# Patient Record
Sex: Female | Born: 1940
Health system: Southern US, Community
[De-identification: ages and names within clinical notes are randomized; demographics above are authoritative.]

## PROBLEM LIST (undated history)

## (undated) DIAGNOSIS — G4730 Sleep apnea, unspecified: Secondary | ICD-10-CM

## (undated) DIAGNOSIS — K219 Gastro-esophageal reflux disease without esophagitis: Secondary | ICD-10-CM

## (undated) DIAGNOSIS — E7800 Pure hypercholesterolemia, unspecified: Secondary | ICD-10-CM

## (undated) DIAGNOSIS — I10 Essential (primary) hypertension: Secondary | ICD-10-CM

## (undated) DIAGNOSIS — M8580 Other specified disorders of bone density and structure, unspecified site: Secondary | ICD-10-CM

## (undated) HISTORY — PX: TUBAL LIGATION: SHX77

## (undated) HISTORY — PX: BREAST SURGERY: SHX581

## (undated) HISTORY — DX: Other specified disorders of bone density and structure, unspecified site: M85.80

## (undated) HISTORY — PX: WRIST SURGERY: SHX841

## (undated) HISTORY — PX: APPENDECTOMY: SHX54

## (undated) HISTORY — DX: Pure hypercholesterolemia, unspecified: E78.00

## (undated) HISTORY — PX: TONSILLECTOMY AND ADENOIDECTOMY: SUR1326

## (undated) HISTORY — DX: Essential (primary) hypertension: I10

---

## 1968-11-05 HISTORY — PX: LIPOMA EXCISION: SHX5283

## 1998-10-13 ENCOUNTER — Other Ambulatory Visit: Admission: RE | Admit: 1998-10-13 | Discharge: 1998-10-13 | Payer: Self-pay | Admitting: Obstetrics and Gynecology

## 1999-11-16 ENCOUNTER — Other Ambulatory Visit: Admission: RE | Admit: 1999-11-16 | Discharge: 1999-11-16 | Payer: Self-pay | Admitting: Obstetrics and Gynecology

## 2000-11-22 ENCOUNTER — Other Ambulatory Visit: Admission: RE | Admit: 2000-11-22 | Discharge: 2000-11-22 | Payer: Self-pay | Admitting: Obstetrics and Gynecology

## 2001-11-26 ENCOUNTER — Other Ambulatory Visit: Admission: RE | Admit: 2001-11-26 | Discharge: 2001-11-26 | Payer: Self-pay | Admitting: Obstetrics and Gynecology

## 2003-02-05 ENCOUNTER — Other Ambulatory Visit: Admission: RE | Admit: 2003-02-05 | Discharge: 2003-02-05 | Payer: Self-pay | Admitting: Obstetrics and Gynecology

## 2004-02-07 ENCOUNTER — Other Ambulatory Visit: Admission: RE | Admit: 2004-02-07 | Discharge: 2004-02-07 | Payer: Self-pay | Admitting: Obstetrics and Gynecology

## 2005-02-26 ENCOUNTER — Other Ambulatory Visit: Admission: RE | Admit: 2005-02-26 | Discharge: 2005-02-26 | Payer: Self-pay | Admitting: Obstetrics and Gynecology

## 2006-06-11 ENCOUNTER — Other Ambulatory Visit: Admission: RE | Admit: 2006-06-11 | Discharge: 2006-06-11 | Payer: Self-pay | Admitting: Obstetrics and Gynecology

## 2007-09-29 ENCOUNTER — Encounter: Admission: RE | Admit: 2007-09-29 | Discharge: 2007-09-29 | Payer: Self-pay | Admitting: Orthopedic Surgery

## 2008-11-26 ENCOUNTER — Other Ambulatory Visit: Admission: RE | Admit: 2008-11-26 | Discharge: 2008-11-26 | Payer: Self-pay | Admitting: Obstetrics and Gynecology

## 2008-11-26 ENCOUNTER — Encounter: Payer: Self-pay | Admitting: Obstetrics and Gynecology

## 2008-11-26 ENCOUNTER — Ambulatory Visit: Payer: Self-pay | Admitting: Obstetrics and Gynecology

## 2008-12-20 ENCOUNTER — Ambulatory Visit: Payer: Self-pay | Admitting: Obstetrics and Gynecology

## 2009-11-28 ENCOUNTER — Ambulatory Visit: Payer: Self-pay | Admitting: Obstetrics and Gynecology

## 2011-07-19 ENCOUNTER — Other Ambulatory Visit: Payer: Self-pay | Admitting: *Deleted

## 2011-07-19 DIAGNOSIS — M949 Disorder of cartilage, unspecified: Secondary | ICD-10-CM

## 2011-07-24 ENCOUNTER — Other Ambulatory Visit: Payer: Self-pay

## 2011-07-24 DIAGNOSIS — N6310 Unspecified lump in the right breast, unspecified quadrant: Secondary | ICD-10-CM

## 2011-07-24 NOTE — Progress Notes (Signed)
FAXED ORDERS TO [facility] 07-23-11.

## 2011-07-30 ENCOUNTER — Other Ambulatory Visit: Payer: Self-pay | Admitting: Gynecology

## 2011-07-30 DIAGNOSIS — N6310 Unspecified lump in the right breast, unspecified quadrant: Secondary | ICD-10-CM

## 2011-08-02 ENCOUNTER — Encounter: Payer: Self-pay | Admitting: Obstetrics and Gynecology

## 2011-09-24 ENCOUNTER — Encounter: Payer: Self-pay | Admitting: Gynecology

## 2011-09-24 DIAGNOSIS — M8580 Other specified disorders of bone density and structure, unspecified site: Secondary | ICD-10-CM | POA: Insufficient documentation

## 2011-10-03 ENCOUNTER — Encounter: Payer: Self-pay | Admitting: Obstetrics and Gynecology

## 2011-10-03 ENCOUNTER — Ambulatory Visit (INDEPENDENT_AMBULATORY_CARE_PROVIDER_SITE_OTHER): Payer: Medicare Other | Admitting: Obstetrics and Gynecology

## 2011-10-03 ENCOUNTER — Other Ambulatory Visit (HOSPITAL_COMMUNITY)
Admission: RE | Admit: 2011-10-03 | Discharge: 2011-10-03 | Disposition: A | Discharge status: Home / Self Care | Payer: Medicare Other | Source: Ambulatory Visit | Attending: Obstetrics and Gynecology | Admitting: Obstetrics and Gynecology

## 2011-10-03 VITALS — BP 124/74 | Ht 62.0 in | Wt 145.0 lb

## 2011-10-03 DIAGNOSIS — Z780 Asymptomatic menopausal state: Secondary | ICD-10-CM

## 2011-10-03 DIAGNOSIS — Z124 Encounter for screening for malignant neoplasm of cervix: Secondary | ICD-10-CM | POA: Insufficient documentation

## 2011-10-03 DIAGNOSIS — M899 Disorder of bone, unspecified: Secondary | ICD-10-CM

## 2011-10-03 DIAGNOSIS — N952 Postmenopausal atrophic vaginitis: Secondary | ICD-10-CM

## 2011-10-03 DIAGNOSIS — N951 Menopausal and female climacteric states: Secondary | ICD-10-CM

## 2011-10-03 DIAGNOSIS — N63 Unspecified lump in unspecified breast: Secondary | ICD-10-CM

## 2011-10-03 DIAGNOSIS — M8580 Other specified disorders of bone density and structure, unspecified site: Secondary | ICD-10-CM

## 2011-10-03 NOTE — Progress Notes (Signed)
Subjective:     Patient ID: Ann Fox, female   DOB: 10-Mar-1941, 70 y.o.   MRN: [id number]  HPIthe patient came back to see me today for further followup. We have had her on Boniva for bone loss. She has now stopped it and her bone density has remained stable. She is taking calcium and vitamin D. She's had no fractures. We are also watching her with a lump in her right breast. It has remained stable on both mammogram and ultrasound. The radiologist diagnosis is that it's a lipoma. She is having minimal hot flashes. She is also having vaginal dryness. She has used both a vaginal ring and vagifem.  She is currently using nothing and feels she is okay without treatment. She is having no vaginal bleeding or pelvic pain.   Review of Systems  Constitutional: Negative.   HENT: Negative.   Eyes: Negative.   Respiratory: Negative.   Cardiovascular:       Hyperlipidemia  Gastrointestinal:       GERD on treatment  Genitourinary: Negative.   Musculoskeletal: Positive for arthralgias.  Skin: Negative.   Neurological: Negative.   Hematological: Negative.   Psychiatric/Behavioral: Negative.        Objective:   Physical ExamPhysical examination:  [name] present HEENT within normal limits. Neck: Thyroid not large. No masses. Supraclavicular nodes: not enlarged. Breasts: Examined in both sitting midline position. No skin changes and no masses in left breast. In right breast at 9:00 there is a dominant lesion of 1+ centimeter unchanged from previous exams. Abdomen: Soft no guarding rebound or masses or hernia. Pelvic: External: Within normal limits. BUS: Within normal limits. Vaginal:within normal limits. Good estrogen effect. No evidence of cystocele rectocele or enterocele. Cervix: clean. Uterus: Normal size and shape. Adnexa: No masses. Rectovaginal exam: Confirmatory and negative. Extremities: Within normal limits.    Assessment:    #1. Stable lipoma right breast #2. Atrophic vaginitis  #3. Osteopenia #4. Menopausal symptoms     Plan:     We a long discussion about her osteopenia. Although she has significant bone loss we have elected for the moment to continue her drug holiday. She will continue calcium and vitamin D. Obviously she will report to me any fractures. We will continue to reassess this. We will continue observation of her lipoma. She will continue yearly mammograms.

## 2014-09-06 ENCOUNTER — Encounter: Payer: Self-pay | Admitting: Obstetrics and Gynecology

## 2015-11-10 ENCOUNTER — Other Ambulatory Visit: Payer: Self-pay | Admitting: Orthopaedic Surgery

## 2015-11-10 DIAGNOSIS — M25511 Pain in right shoulder: Secondary | ICD-10-CM

## 2015-11-17 ENCOUNTER — Other Ambulatory Visit: Payer: BC Managed Care – PPO

## 2015-11-24 ENCOUNTER — Ambulatory Visit
Admission: RE | Admit: 2015-11-24 | Discharge: 2015-11-24 | Disposition: A | Discharge status: Home / Self Care | Payer: PPO | Source: Ambulatory Visit | Attending: Orthopaedic Surgery | Admitting: Orthopaedic Surgery

## 2015-11-24 DIAGNOSIS — M19011 Primary osteoarthritis, right shoulder: Secondary | ICD-10-CM | POA: Diagnosis not present

## 2015-11-24 DIAGNOSIS — M25511 Pain in right shoulder: Secondary | ICD-10-CM

## 2016-01-05 DIAGNOSIS — L728 Other follicular cysts of the skin and subcutaneous tissue: Secondary | ICD-10-CM | POA: Diagnosis not present

## 2016-01-16 DIAGNOSIS — H2513 Age-related nuclear cataract, bilateral: Secondary | ICD-10-CM | POA: Diagnosis not present

## 2016-03-30 DIAGNOSIS — H25812 Combined forms of age-related cataract, left eye: Secondary | ICD-10-CM | POA: Diagnosis not present

## 2016-04-16 DIAGNOSIS — H2513 Age-related nuclear cataract, bilateral: Secondary | ICD-10-CM | POA: Diagnosis not present

## 2016-04-16 DIAGNOSIS — H2512 Age-related nuclear cataract, left eye: Secondary | ICD-10-CM | POA: Diagnosis not present

## 2016-04-16 DIAGNOSIS — H25812 Combined forms of age-related cataract, left eye: Secondary | ICD-10-CM | POA: Diagnosis not present

## 2016-04-25 DIAGNOSIS — H2513 Age-related nuclear cataract, bilateral: Secondary | ICD-10-CM | POA: Diagnosis not present

## 2016-04-25 DIAGNOSIS — H25811 Combined forms of age-related cataract, right eye: Secondary | ICD-10-CM | POA: Diagnosis not present

## 2016-04-25 DIAGNOSIS — H2511 Age-related nuclear cataract, right eye: Secondary | ICD-10-CM | POA: Diagnosis not present

## 2016-04-25 DIAGNOSIS — H40013 Open angle with borderline findings, low risk, bilateral: Secondary | ICD-10-CM | POA: Diagnosis not present

## 2016-07-03 DIAGNOSIS — I10 Essential (primary) hypertension: Secondary | ICD-10-CM | POA: Diagnosis not present

## 2016-07-03 DIAGNOSIS — E782 Mixed hyperlipidemia: Secondary | ICD-10-CM | POA: Diagnosis not present

## 2016-07-03 DIAGNOSIS — M2550 Pain in unspecified joint: Secondary | ICD-10-CM | POA: Diagnosis not present

## 2016-07-03 DIAGNOSIS — G4730 Sleep apnea, unspecified: Secondary | ICD-10-CM | POA: Diagnosis not present

## 2016-07-03 DIAGNOSIS — K219 Gastro-esophageal reflux disease without esophagitis: Secondary | ICD-10-CM | POA: Diagnosis not present

## 2016-07-03 DIAGNOSIS — Z0000 Encounter for general adult medical examination without abnormal findings: Secondary | ICD-10-CM | POA: Diagnosis not present

## 2016-07-03 DIAGNOSIS — Z9181 History of falling: Secondary | ICD-10-CM | POA: Diagnosis not present

## 2016-07-03 DIAGNOSIS — Z79899 Other long term (current) drug therapy: Secondary | ICD-10-CM | POA: Diagnosis not present

## 2016-08-06 DIAGNOSIS — G4733 Obstructive sleep apnea (adult) (pediatric): Secondary | ICD-10-CM | POA: Diagnosis not present

## 2016-08-07 DIAGNOSIS — H02831 Dermatochalasis of right upper eyelid: Secondary | ICD-10-CM | POA: Diagnosis not present

## 2016-08-07 DIAGNOSIS — H02834 Dermatochalasis of left upper eyelid: Secondary | ICD-10-CM | POA: Diagnosis not present

## 2016-08-08 DIAGNOSIS — L728 Other follicular cysts of the skin and subcutaneous tissue: Secondary | ICD-10-CM | POA: Diagnosis not present

## 2016-08-08 DIAGNOSIS — L918 Other hypertrophic disorders of the skin: Secondary | ICD-10-CM | POA: Diagnosis not present

## 2016-08-31 DIAGNOSIS — H02834 Dermatochalasis of left upper eyelid: Secondary | ICD-10-CM | POA: Diagnosis not present

## 2016-08-31 DIAGNOSIS — H53453 Other localized visual field defect, bilateral: Secondary | ICD-10-CM | POA: Diagnosis not present

## 2016-08-31 DIAGNOSIS — H02831 Dermatochalasis of right upper eyelid: Secondary | ICD-10-CM | POA: Diagnosis not present

## 2016-09-12 DIAGNOSIS — Z23 Encounter for immunization: Secondary | ICD-10-CM | POA: Diagnosis not present

## 2016-09-13 DIAGNOSIS — G4733 Obstructive sleep apnea (adult) (pediatric): Secondary | ICD-10-CM | POA: Diagnosis not present

## 2016-09-13 DIAGNOSIS — I10 Essential (primary) hypertension: Secondary | ICD-10-CM | POA: Diagnosis not present

## 2016-10-08 DIAGNOSIS — H04123 Dry eye syndrome of bilateral lacrimal glands: Secondary | ICD-10-CM | POA: Diagnosis not present

## 2016-10-08 DIAGNOSIS — H52223 Regular astigmatism, bilateral: Secondary | ICD-10-CM | POA: Diagnosis not present

## 2016-10-13 DIAGNOSIS — I10 Essential (primary) hypertension: Secondary | ICD-10-CM | POA: Diagnosis not present

## 2016-10-13 DIAGNOSIS — G4733 Obstructive sleep apnea (adult) (pediatric): Secondary | ICD-10-CM | POA: Diagnosis not present

## 2016-11-13 DIAGNOSIS — I10 Essential (primary) hypertension: Secondary | ICD-10-CM | POA: Diagnosis not present

## 2016-11-13 DIAGNOSIS — G4733 Obstructive sleep apnea (adult) (pediatric): Secondary | ICD-10-CM | POA: Diagnosis not present

## 2016-11-30 DIAGNOSIS — L570 Actinic keratosis: Secondary | ICD-10-CM | POA: Diagnosis not present

## 2016-11-30 DIAGNOSIS — L578 Other skin changes due to chronic exposure to nonionizing radiation: Secondary | ICD-10-CM | POA: Diagnosis not present

## 2016-11-30 DIAGNOSIS — L821 Other seborrheic keratosis: Secondary | ICD-10-CM | POA: Diagnosis not present

## 2016-12-13 DIAGNOSIS — G4733 Obstructive sleep apnea (adult) (pediatric): Secondary | ICD-10-CM | POA: Diagnosis not present

## 2016-12-13 DIAGNOSIS — Z1389 Encounter for screening for other disorder: Secondary | ICD-10-CM | POA: Diagnosis not present

## 2016-12-14 DIAGNOSIS — I10 Essential (primary) hypertension: Secondary | ICD-10-CM | POA: Diagnosis not present

## 2016-12-14 DIAGNOSIS — G4733 Obstructive sleep apnea (adult) (pediatric): Secondary | ICD-10-CM | POA: Diagnosis not present

## 2016-12-15 DIAGNOSIS — G4733 Obstructive sleep apnea (adult) (pediatric): Secondary | ICD-10-CM | POA: Diagnosis not present

## 2016-12-15 DIAGNOSIS — I10 Essential (primary) hypertension: Secondary | ICD-10-CM | POA: Diagnosis not present

## 2017-01-11 DIAGNOSIS — G4733 Obstructive sleep apnea (adult) (pediatric): Secondary | ICD-10-CM | POA: Diagnosis not present

## 2017-01-11 DIAGNOSIS — I10 Essential (primary) hypertension: Secondary | ICD-10-CM | POA: Diagnosis not present

## 2017-01-29 DIAGNOSIS — R102 Pelvic and perineal pain: Secondary | ICD-10-CM | POA: Diagnosis not present

## 2017-01-29 DIAGNOSIS — N814 Uterovaginal prolapse, unspecified: Secondary | ICD-10-CM | POA: Diagnosis not present

## 2017-02-11 DIAGNOSIS — G4733 Obstructive sleep apnea (adult) (pediatric): Secondary | ICD-10-CM | POA: Diagnosis not present

## 2017-02-11 DIAGNOSIS — I10 Essential (primary) hypertension: Secondary | ICD-10-CM | POA: Diagnosis not present

## 2017-03-05 DIAGNOSIS — N814 Uterovaginal prolapse, unspecified: Secondary | ICD-10-CM | POA: Diagnosis not present

## 2017-03-13 DIAGNOSIS — G4733 Obstructive sleep apnea (adult) (pediatric): Secondary | ICD-10-CM | POA: Diagnosis not present

## 2017-03-13 DIAGNOSIS — I10 Essential (primary) hypertension: Secondary | ICD-10-CM | POA: Diagnosis not present

## 2017-03-18 DIAGNOSIS — G4733 Obstructive sleep apnea (adult) (pediatric): Secondary | ICD-10-CM | POA: Diagnosis not present

## 2017-03-18 DIAGNOSIS — I10 Essential (primary) hypertension: Secondary | ICD-10-CM | POA: Diagnosis not present

## 2017-03-26 DIAGNOSIS — N814 Uterovaginal prolapse, unspecified: Secondary | ICD-10-CM | POA: Diagnosis not present

## 2017-04-13 DIAGNOSIS — G4733 Obstructive sleep apnea (adult) (pediatric): Secondary | ICD-10-CM | POA: Diagnosis not present

## 2017-04-13 DIAGNOSIS — I10 Essential (primary) hypertension: Secondary | ICD-10-CM | POA: Diagnosis not present

## 2017-04-16 DIAGNOSIS — N814 Uterovaginal prolapse, unspecified: Secondary | ICD-10-CM | POA: Diagnosis not present

## 2017-05-13 DIAGNOSIS — I10 Essential (primary) hypertension: Secondary | ICD-10-CM | POA: Diagnosis not present

## 2017-05-13 DIAGNOSIS — G4733 Obstructive sleep apnea (adult) (pediatric): Secondary | ICD-10-CM | POA: Diagnosis not present

## 2017-05-15 DIAGNOSIS — Z4689 Encounter for fitting and adjustment of other specified devices: Secondary | ICD-10-CM | POA: Diagnosis not present

## 2017-06-13 DIAGNOSIS — G4733 Obstructive sleep apnea (adult) (pediatric): Secondary | ICD-10-CM | POA: Diagnosis not present

## 2017-06-13 DIAGNOSIS — I10 Essential (primary) hypertension: Secondary | ICD-10-CM | POA: Diagnosis not present

## 2017-07-09 DIAGNOSIS — Z800 Family history of malignant neoplasm of digestive organs: Secondary | ICD-10-CM | POA: Diagnosis not present

## 2017-07-09 DIAGNOSIS — Z1231 Encounter for screening mammogram for malignant neoplasm of breast: Secondary | ICD-10-CM | POA: Diagnosis not present

## 2017-07-14 DIAGNOSIS — I10 Essential (primary) hypertension: Secondary | ICD-10-CM | POA: Diagnosis not present

## 2017-07-14 DIAGNOSIS — G4733 Obstructive sleep apnea (adult) (pediatric): Secondary | ICD-10-CM | POA: Diagnosis not present

## 2017-07-24 DIAGNOSIS — I10 Essential (primary) hypertension: Secondary | ICD-10-CM | POA: Diagnosis not present

## 2017-07-24 DIAGNOSIS — M8580 Other specified disorders of bone density and structure, unspecified site: Secondary | ICD-10-CM | POA: Diagnosis not present

## 2017-07-24 DIAGNOSIS — E782 Mixed hyperlipidemia: Secondary | ICD-10-CM | POA: Diagnosis not present

## 2017-07-24 DIAGNOSIS — N814 Uterovaginal prolapse, unspecified: Secondary | ICD-10-CM | POA: Diagnosis not present

## 2017-07-24 DIAGNOSIS — K219 Gastro-esophageal reflux disease without esophagitis: Secondary | ICD-10-CM | POA: Diagnosis not present

## 2017-07-24 DIAGNOSIS — M129 Arthropathy, unspecified: Secondary | ICD-10-CM | POA: Diagnosis not present

## 2017-07-24 DIAGNOSIS — Z23 Encounter for immunization: Secondary | ICD-10-CM | POA: Diagnosis not present

## 2017-07-24 DIAGNOSIS — M25552 Pain in left hip: Secondary | ICD-10-CM | POA: Diagnosis not present

## 2017-07-24 DIAGNOSIS — M25551 Pain in right hip: Secondary | ICD-10-CM | POA: Diagnosis not present

## 2017-08-02 DIAGNOSIS — I10 Essential (primary) hypertension: Secondary | ICD-10-CM | POA: Diagnosis not present

## 2017-08-02 DIAGNOSIS — G4733 Obstructive sleep apnea (adult) (pediatric): Secondary | ICD-10-CM | POA: Diagnosis not present

## 2017-08-12 DIAGNOSIS — L719 Rosacea, unspecified: Secondary | ICD-10-CM | POA: Diagnosis not present

## 2017-08-13 DIAGNOSIS — G4733 Obstructive sleep apnea (adult) (pediatric): Secondary | ICD-10-CM | POA: Diagnosis not present

## 2017-08-13 DIAGNOSIS — I10 Essential (primary) hypertension: Secondary | ICD-10-CM | POA: Diagnosis not present

## 2017-09-13 DIAGNOSIS — I10 Essential (primary) hypertension: Secondary | ICD-10-CM | POA: Diagnosis not present

## 2017-09-13 DIAGNOSIS — G4733 Obstructive sleep apnea (adult) (pediatric): Secondary | ICD-10-CM | POA: Diagnosis not present

## 2017-09-24 DIAGNOSIS — L304 Erythema intertrigo: Secondary | ICD-10-CM | POA: Diagnosis not present

## 2017-09-24 DIAGNOSIS — L918 Other hypertrophic disorders of the skin: Secondary | ICD-10-CM | POA: Diagnosis not present

## 2017-11-03 DIAGNOSIS — I10 Essential (primary) hypertension: Secondary | ICD-10-CM | POA: Diagnosis not present

## 2017-11-03 DIAGNOSIS — G4733 Obstructive sleep apnea (adult) (pediatric): Secondary | ICD-10-CM | POA: Diagnosis not present

## 2017-11-04 DIAGNOSIS — S60912A Unspecified superficial injury of left wrist, initial encounter: Secondary | ICD-10-CM | POA: Diagnosis not present

## 2017-11-09 DIAGNOSIS — J069 Acute upper respiratory infection, unspecified: Secondary | ICD-10-CM | POA: Diagnosis not present

## 2018-01-21 DIAGNOSIS — E782 Mixed hyperlipidemia: Secondary | ICD-10-CM | POA: Diagnosis not present

## 2018-01-21 DIAGNOSIS — I10 Essential (primary) hypertension: Secondary | ICD-10-CM | POA: Diagnosis not present

## 2018-01-21 DIAGNOSIS — K219 Gastro-esophageal reflux disease without esophagitis: Secondary | ICD-10-CM | POA: Diagnosis not present

## 2018-01-21 DIAGNOSIS — Z0000 Encounter for general adult medical examination without abnormal findings: Secondary | ICD-10-CM | POA: Diagnosis not present

## 2018-01-21 DIAGNOSIS — R200 Anesthesia of skin: Secondary | ICD-10-CM | POA: Diagnosis not present

## 2018-02-07 DIAGNOSIS — R200 Anesthesia of skin: Secondary | ICD-10-CM | POA: Diagnosis not present

## 2018-04-09 DIAGNOSIS — S93401A Sprain of unspecified ligament of right ankle, initial encounter: Secondary | ICD-10-CM | POA: Diagnosis not present

## 2018-04-09 DIAGNOSIS — S9001XA Contusion of right ankle, initial encounter: Secondary | ICD-10-CM | POA: Diagnosis not present

## 2018-04-09 DIAGNOSIS — S93601A Unspecified sprain of right foot, initial encounter: Secondary | ICD-10-CM | POA: Diagnosis not present

## 2018-04-28 DIAGNOSIS — G4733 Obstructive sleep apnea (adult) (pediatric): Secondary | ICD-10-CM | POA: Diagnosis not present

## 2018-04-28 DIAGNOSIS — I10 Essential (primary) hypertension: Secondary | ICD-10-CM | POA: Diagnosis not present

## 2018-07-25 DIAGNOSIS — J069 Acute upper respiratory infection, unspecified: Secondary | ICD-10-CM | POA: Diagnosis not present

## 2018-07-30 DIAGNOSIS — J018 Other acute sinusitis: Secondary | ICD-10-CM | POA: Diagnosis not present

## 2018-07-31 DIAGNOSIS — L820 Inflamed seborrheic keratosis: Secondary | ICD-10-CM | POA: Diagnosis not present

## 2018-09-02 DIAGNOSIS — I10 Essential (primary) hypertension: Secondary | ICD-10-CM | POA: Diagnosis not present

## 2018-09-02 DIAGNOSIS — G4733 Obstructive sleep apnea (adult) (pediatric): Secondary | ICD-10-CM | POA: Diagnosis not present

## 2018-11-11 DIAGNOSIS — R300 Dysuria: Secondary | ICD-10-CM | POA: Diagnosis not present

## 2018-11-11 DIAGNOSIS — Z23 Encounter for immunization: Secondary | ICD-10-CM | POA: Diagnosis not present

## 2018-11-11 DIAGNOSIS — Z1231 Encounter for screening mammogram for malignant neoplasm of breast: Secondary | ICD-10-CM | POA: Diagnosis not present

## 2018-11-11 DIAGNOSIS — R8290 Unspecified abnormal findings in urine: Secondary | ICD-10-CM | POA: Diagnosis not present

## 2019-02-19 DIAGNOSIS — M150 Primary generalized (osteo)arthritis: Secondary | ICD-10-CM | POA: Diagnosis not present

## 2019-02-19 DIAGNOSIS — F5101 Primary insomnia: Secondary | ICD-10-CM | POA: Diagnosis not present

## 2019-02-19 DIAGNOSIS — Z0000 Encounter for general adult medical examination without abnormal findings: Secondary | ICD-10-CM | POA: Diagnosis not present

## 2019-02-19 DIAGNOSIS — K219 Gastro-esophageal reflux disease without esophagitis: Secondary | ICD-10-CM | POA: Diagnosis not present

## 2019-02-19 DIAGNOSIS — Z1211 Encounter for screening for malignant neoplasm of colon: Secondary | ICD-10-CM | POA: Diagnosis not present

## 2019-02-19 DIAGNOSIS — I10 Essential (primary) hypertension: Secondary | ICD-10-CM | POA: Diagnosis not present

## 2019-02-19 DIAGNOSIS — M25562 Pain in left knee: Secondary | ICD-10-CM | POA: Diagnosis not present

## 2019-02-19 DIAGNOSIS — E872 Acidosis: Secondary | ICD-10-CM | POA: Diagnosis not present

## 2019-02-19 DIAGNOSIS — E782 Mixed hyperlipidemia: Secondary | ICD-10-CM | POA: Diagnosis not present

## 2019-02-19 DIAGNOSIS — M1712 Unilateral primary osteoarthritis, left knee: Secondary | ICD-10-CM | POA: Diagnosis not present

## 2019-02-20 DIAGNOSIS — M25562 Pain in left knee: Secondary | ICD-10-CM | POA: Diagnosis not present

## 2019-03-23 DIAGNOSIS — I10 Essential (primary) hypertension: Secondary | ICD-10-CM | POA: Diagnosis not present

## 2019-03-23 DIAGNOSIS — G4733 Obstructive sleep apnea (adult) (pediatric): Secondary | ICD-10-CM | POA: Diagnosis not present

## 2019-05-28 DIAGNOSIS — H43393 Other vitreous opacities, bilateral: Secondary | ICD-10-CM | POA: Diagnosis not present

## 2019-05-28 DIAGNOSIS — H5203 Hypermetropia, bilateral: Secondary | ICD-10-CM | POA: Diagnosis not present

## 2019-05-28 DIAGNOSIS — H04123 Dry eye syndrome of bilateral lacrimal glands: Secondary | ICD-10-CM | POA: Diagnosis not present

## 2019-06-24 DIAGNOSIS — I10 Essential (primary) hypertension: Secondary | ICD-10-CM | POA: Diagnosis not present

## 2019-06-24 DIAGNOSIS — G4733 Obstructive sleep apnea (adult) (pediatric): Secondary | ICD-10-CM | POA: Diagnosis not present

## 2019-09-15 DIAGNOSIS — Z23 Encounter for immunization: Secondary | ICD-10-CM | POA: Diagnosis not present

## 2019-12-29 DIAGNOSIS — G4733 Obstructive sleep apnea (adult) (pediatric): Secondary | ICD-10-CM | POA: Diagnosis not present

## 2019-12-29 DIAGNOSIS — I10 Essential (primary) hypertension: Secondary | ICD-10-CM | POA: Diagnosis not present

## 2020-02-25 DIAGNOSIS — K529 Noninfective gastroenteritis and colitis, unspecified: Secondary | ICD-10-CM | POA: Diagnosis not present

## 2020-02-25 DIAGNOSIS — R1084 Generalized abdominal pain: Secondary | ICD-10-CM | POA: Diagnosis not present

## 2020-03-01 DIAGNOSIS — R1032 Left lower quadrant pain: Secondary | ICD-10-CM | POA: Diagnosis not present

## 2020-03-01 DIAGNOSIS — R109 Unspecified abdominal pain: Secondary | ICD-10-CM | POA: Diagnosis not present

## 2020-03-01 DIAGNOSIS — K219 Gastro-esophageal reflux disease without esophagitis: Secondary | ICD-10-CM | POA: Diagnosis not present

## 2020-04-27 DIAGNOSIS — L718 Other rosacea: Secondary | ICD-10-CM | POA: Diagnosis not present

## 2020-05-20 DIAGNOSIS — Z1231 Encounter for screening mammogram for malignant neoplasm of breast: Secondary | ICD-10-CM | POA: Diagnosis not present

## 2020-06-27 DIAGNOSIS — Z0000 Encounter for general adult medical examination without abnormal findings: Secondary | ICD-10-CM | POA: Diagnosis not present

## 2020-06-27 DIAGNOSIS — M5416 Radiculopathy, lumbar region: Secondary | ICD-10-CM | POA: Diagnosis not present

## 2020-06-27 DIAGNOSIS — M545 Low back pain: Secondary | ICD-10-CM | POA: Diagnosis not present

## 2020-07-07 DIAGNOSIS — M5441 Lumbago with sciatica, right side: Secondary | ICD-10-CM | POA: Diagnosis not present

## 2020-07-07 DIAGNOSIS — M25551 Pain in right hip: Secondary | ICD-10-CM | POA: Diagnosis not present

## 2020-07-21 ENCOUNTER — Ambulatory Visit: Payer: Self-pay

## 2020-07-21 ENCOUNTER — Other Ambulatory Visit: Payer: Self-pay

## 2020-07-21 ENCOUNTER — Ambulatory Visit (INDEPENDENT_AMBULATORY_CARE_PROVIDER_SITE_OTHER): Payer: PPO | Admitting: Orthopaedic Surgery

## 2020-07-21 ENCOUNTER — Encounter: Payer: Self-pay | Admitting: Orthopaedic Surgery

## 2020-07-21 VITALS — Ht 62.0 in | Wt 148.0 lb

## 2020-07-21 DIAGNOSIS — M4807 Spinal stenosis, lumbosacral region: Secondary | ICD-10-CM | POA: Diagnosis not present

## 2020-07-21 DIAGNOSIS — M5136 Other intervertebral disc degeneration, lumbar region: Secondary | ICD-10-CM | POA: Diagnosis not present

## 2020-07-21 DIAGNOSIS — M5441 Lumbago with sciatica, right side: Secondary | ICD-10-CM | POA: Diagnosis not present

## 2020-07-21 DIAGNOSIS — M51369 Other intervertebral disc degeneration, lumbar region without mention of lumbar back pain or lower extremity pain: Secondary | ICD-10-CM | POA: Insufficient documentation

## 2020-07-21 DIAGNOSIS — M79604 Pain in right leg: Secondary | ICD-10-CM

## 2020-07-21 DIAGNOSIS — I700 Atherosclerosis of aorta: Secondary | ICD-10-CM | POA: Diagnosis not present

## 2020-07-21 NOTE — Progress Notes (Signed)
Office Visit Note   Patient: Ann Fox           Date of Birth: [birth year]           MRN: [id number] Visit Date: 07/21/2020              Requested by: Hadley Pen, MD [address] [place],  [place] [address] PCP: Hadley Pen, MD   Assessment & Plan: Visit Diagnoses:  1. Pain in right leg   2. Spinal stenosis of lumbosacral region   3. Right-sided low back pain with right-sided sciatica, unspecified chronicity   4. Degenerative disc disease, lumbar   5. Calcification of abdominal aorta (HCC)     Plan:  #1: Going to obtain an MRI scan lumbar spine to rule out spinal stenosis #2: We are going to also obtain an ultrasound of the aorta to evaluate the calcification  Follow-Up Instructions: Return in about 2 weeks (around 08/04/2020).   Face-to-face time spent with patient was greater than 40 minutes.  Greater than 50% of the time was spent in counseling and coordination of care.  Orders:  Orders Placed This Encounter  Procedures  . XR Lumbar Spine 2-3 Views  . XR Pelvis 1-2 Views  . MR Lumbar Spine w/o contrast   No orders of the defined types were placed in this encounter.     Procedures: No procedures performed   Clinical Data: No additional findings.   Subjective: Chief Complaint  Patient presents with  . Lower Back - Pain   HPI Patient presents today for her right lower buttock pain. She said that it started about a month ago. Her pain originates in her buttock and radiates posteriorly and laterally down her leg. She has numbness and tingling down to her foot. She also reports weakness in her right leg. Some groin pain. She has been taking Aleve or Advil for pain. She saw her PCP and had x-rays taken, but we do not have access to those.   Review of Systems  Constitutional: Negative for fatigue.  HENT: Negative for ear pain.   Eyes: Negative for pain.  Respiratory: Negative for shortness of breath.   Cardiovascular: Negative for leg  swelling.  Gastrointestinal: Negative for constipation and diarrhea.  Endocrine: Negative for cold intolerance and heat intolerance.  Genitourinary: Negative for difficulty urinating.  Musculoskeletal: Negative for joint swelling.  Skin: Negative for rash.  Allergic/Immunologic: Negative for food allergies.  Neurological: Positive for weakness.  Hematological: Does not bruise/bleed easily.  Psychiatric/Behavioral: Positive for sleep disturbance.     Objective: Vital Signs: Ht 5\' 2"  (1.575 m)   Wt 148 lb (67.1 kg)   BMI 27.07 kg/m   Physical Exam Constitutional:      Appearance: Normal appearance. She is well-developed.  HENT:     Head: Normocephalic.  Eyes:     Pupils: Pupils are equal, round, and reactive to light.  Pulmonary:     Effort: Pulmonary effort is normal.  Skin:    General: Skin is warm and dry.  Neurological:     Mental Status: She is alert and oriented to person, place, and time.  Psychiatric:        Behavior: Behavior normal.     Ortho Exam  Exam today reveals 2+ posterior tib pulses bilaterally.  Trace dorsalis pedis bilaterally.  Deep tendon reflexes were decreased at the knee and the ankle on the right in comparison to the left.  Sensation is also decreased along  the lateral calf on the right.  She does have some groin pain with internal and external rotation of the hip.  But this is minimal.  Positive straight leg raising on the right at 90 degrees.  Specialty Comments:  No specialty comments available.  Imaging: No results found.   PMFS History: Patient Active Problem List   Diagnosis Date Noted  . Lumbago with sciatica, right side 07/21/2020  . Degenerative disc disease, lumbar 07/21/2020  . Calcification of abdominal aorta (HCC) 07/21/2020  . Osteopenia    Past Medical History:  Diagnosis Date  . Elevated cholesterol   . Hypertension   . Osteopenia     Family History  Problem Relation Age of Onset  . Cancer Mother        Bladder  cancer  . Osteoporosis Mother   . Colon cancer Father   . Hypertension Father   . Cancer Maternal Grandmother   . Breast cancer Cousin        Mat. 1st cousins  . Cancer Maternal Uncle     Past Surgical History:  Procedure Laterality Date  . APPENDECTOMY    . BREAST SURGERY     Breast mass  . TONSILLECTOMY AND ADENOIDECTOMY    . TUBAL LIGATION    . WRIST SURGERY     Cyst   Social History   Occupational History  . Not on file  Tobacco Use  . Smoking status: Former Smoker  Substance and Sexual Activity  . Alcohol use: No  . Drug use: Not on file  . Sexual activity: Yes    Birth control/protection: Post-menopausal

## 2020-07-26 DIAGNOSIS — M25551 Pain in right hip: Secondary | ICD-10-CM | POA: Diagnosis not present

## 2020-07-26 DIAGNOSIS — M2560 Stiffness of unspecified joint, not elsewhere classified: Secondary | ICD-10-CM | POA: Diagnosis not present

## 2020-07-26 DIAGNOSIS — M5416 Radiculopathy, lumbar region: Secondary | ICD-10-CM | POA: Diagnosis not present

## 2020-07-29 DIAGNOSIS — M5416 Radiculopathy, lumbar region: Secondary | ICD-10-CM | POA: Diagnosis not present

## 2020-07-29 DIAGNOSIS — M2560 Stiffness of unspecified joint, not elsewhere classified: Secondary | ICD-10-CM | POA: Diagnosis not present

## 2020-07-29 DIAGNOSIS — M25551 Pain in right hip: Secondary | ICD-10-CM | POA: Diagnosis not present

## 2020-08-01 DIAGNOSIS — M2560 Stiffness of unspecified joint, not elsewhere classified: Secondary | ICD-10-CM | POA: Diagnosis not present

## 2020-08-01 DIAGNOSIS — M5416 Radiculopathy, lumbar region: Secondary | ICD-10-CM | POA: Diagnosis not present

## 2020-08-01 DIAGNOSIS — M25551 Pain in right hip: Secondary | ICD-10-CM | POA: Diagnosis not present

## 2020-08-03 DIAGNOSIS — M25551 Pain in right hip: Secondary | ICD-10-CM | POA: Diagnosis not present

## 2020-08-03 DIAGNOSIS — M2560 Stiffness of unspecified joint, not elsewhere classified: Secondary | ICD-10-CM | POA: Diagnosis not present

## 2020-08-03 DIAGNOSIS — M5416 Radiculopathy, lumbar region: Secondary | ICD-10-CM | POA: Diagnosis not present

## 2020-08-08 ENCOUNTER — Other Ambulatory Visit: Payer: Self-pay | Admitting: Orthopaedic Surgery

## 2020-08-10 DIAGNOSIS — M25551 Pain in right hip: Secondary | ICD-10-CM | POA: Diagnosis not present

## 2020-08-10 DIAGNOSIS — M2560 Stiffness of unspecified joint, not elsewhere classified: Secondary | ICD-10-CM | POA: Diagnosis not present

## 2020-08-10 DIAGNOSIS — M5416 Radiculopathy, lumbar region: Secondary | ICD-10-CM | POA: Diagnosis not present

## 2020-08-12 DIAGNOSIS — M5416 Radiculopathy, lumbar region: Secondary | ICD-10-CM | POA: Diagnosis not present

## 2020-08-12 DIAGNOSIS — M25551 Pain in right hip: Secondary | ICD-10-CM | POA: Diagnosis not present

## 2020-08-12 DIAGNOSIS — M2560 Stiffness of unspecified joint, not elsewhere classified: Secondary | ICD-10-CM | POA: Diagnosis not present

## 2020-08-13 ENCOUNTER — Ambulatory Visit
Admission: RE | Admit: 2020-08-13 | Discharge: 2020-08-13 | Disposition: A | Discharge status: Home / Self Care | Payer: BC Managed Care – PPO | Source: Ambulatory Visit | Attending: Orthopaedic Surgery | Admitting: Orthopaedic Surgery

## 2020-08-13 DIAGNOSIS — M5126 Other intervertebral disc displacement, lumbar region: Secondary | ICD-10-CM | POA: Diagnosis not present

## 2020-08-13 DIAGNOSIS — M4807 Spinal stenosis, lumbosacral region: Secondary | ICD-10-CM

## 2020-08-13 DIAGNOSIS — M48061 Spinal stenosis, lumbar region without neurogenic claudication: Secondary | ICD-10-CM | POA: Diagnosis not present

## 2020-08-15 ENCOUNTER — Telehealth: Payer: Self-pay

## 2020-08-15 DIAGNOSIS — M2560 Stiffness of unspecified joint, not elsewhere classified: Secondary | ICD-10-CM | POA: Diagnosis not present

## 2020-08-15 DIAGNOSIS — M25551 Pain in right hip: Secondary | ICD-10-CM | POA: Diagnosis not present

## 2020-08-15 DIAGNOSIS — M5416 Radiculopathy, lumbar region: Secondary | ICD-10-CM | POA: Diagnosis not present

## 2020-08-15 NOTE — Progress Notes (Signed)
Please be sure Mrs Cogliano has an appt in the near future-has significant findings on the MRI

## 2020-08-15 NOTE — Telephone Encounter (Signed)
Tried to call patient at both numbers on the chart. No answer. Left message on both voicemails that we need to schedule an appointment to review her MRI results. I ask her to call back for scheduling.

## 2020-08-18 ENCOUNTER — Ambulatory Visit (INDEPENDENT_AMBULATORY_CARE_PROVIDER_SITE_OTHER): Payer: PPO | Admitting: Orthopaedic Surgery

## 2020-08-18 ENCOUNTER — Encounter: Payer: Self-pay | Admitting: Orthopaedic Surgery

## 2020-08-18 ENCOUNTER — Other Ambulatory Visit: Payer: Self-pay

## 2020-08-18 VITALS — Ht 62.0 in | Wt 148.0 lb

## 2020-08-18 DIAGNOSIS — M5126 Other intervertebral disc displacement, lumbar region: Secondary | ICD-10-CM | POA: Diagnosis not present

## 2020-08-18 DIAGNOSIS — M5136 Other intervertebral disc degeneration, lumbar region: Secondary | ICD-10-CM | POA: Diagnosis not present

## 2020-08-18 DIAGNOSIS — M5441 Lumbago with sciatica, right side: Secondary | ICD-10-CM | POA: Diagnosis not present

## 2020-08-18 DIAGNOSIS — I700 Atherosclerosis of aorta: Secondary | ICD-10-CM

## 2020-08-18 DIAGNOSIS — M51369 Other intervertebral disc degeneration, lumbar region without mention of lumbar back pain or lower extremity pain: Secondary | ICD-10-CM

## 2020-08-18 NOTE — Progress Notes (Signed)
Office Visit Note   Patient: Ann Fox           Date of Birth: August 08, 1941           MRN: [id number] Visit Date: 08/18/2020              Requested by: Hadley Pen, MD [address] [place],  [place] [address] PCP: Hadley Pen, MD   Assessment & Plan: Visit Diagnoses:  1. Right-sided low back pain with right-sided sciatica, unspecified chronicity   2. Calcification of abdominal aorta (HCC)   3. Degenerative disc disease, lumbar   4. Lumbar herniated disc     Plan:  #1: At this time since her symptoms are markedly improved she would like to avoid any type of other treatment that of epidurals. #2: Explained to her that if she has any problems with bowel or bladder dysfunction or increasing pain or weakness she is to call immediately. #3: Otherwise we will see her back on a as needed basis. #4: The aorta was not scanned  Ordered.  Follow-Up Instructions: Return if symptoms worsen or fail to improve.   Orders:  Orders Placed This Encounter  Procedures  . US AORTA   No orders of the defined types were placed in this encounter.     Procedures: No procedures performed   Clinical Data: No additional findings.   Subjective: Chief Complaint  Patient presents with  . Lower Back - Follow-up    MRI review   HPI Patient presents today for follow up on her lower back. She had an MRI of her L-Spine on 08/13/2020. She is here today to review those results.  She states that she is much improved and her pain is resolving in the back as well as in the legs.  She is very happy at this time.  Denies any radicular symptoms at this time.  Also much less back pain.  Review of Systems  Constitutional: Negative for fatigue.  HENT: Negative for ear pain.   Eyes: Negative for pain.  Respiratory: Negative for shortness of breath.   Cardiovascular: Negative for leg swelling.  Gastrointestinal: Negative for constipation and diarrhea.  Endocrine: Negative for cold  intolerance and heat intolerance.  Genitourinary: Negative for difficulty urinating.  Musculoskeletal: Negative for joint swelling.  Skin: Negative for rash.  Allergic/Immunologic: Negative for food allergies.  Neurological: Positive for weakness.  Hematological: Does not bruise/bleed easily.  Psychiatric/Behavioral: Positive for sleep disturbance.     Objective: Vital Signs: Ht 5\' 2"  (1.575 m)   Wt 148 lb (67.1 kg)   BMI 27.07 kg/m   Physical Exam Constitutional:      Appearance: Normal appearance. She is well-developed.  HENT:     Head: Normocephalic.  Eyes:     Pupils: Pupils are equal, round, and reactive to light.  Pulmonary:     Effort: Pulmonary effort is normal.  Skin:    General: Skin is warm and dry.  Neurological:     Mental Status: She is alert and oriented to person, place, and time.  Psychiatric:        Behavior: Behavior normal.     Ortho Exam  Exam today reveals her posterior tib pulses to be a 2+ bilateral and symmetric.  Trace dorsalis pedis bilaterally.  Deep tendon reflexes were decreased equally bilaterally.  Sensation was intact according to her with testing in all planes.  Negative straight leg raising.  Specialty Comments:  No specialty comments available.  Imaging: MR Lumbar Spine w/o contrast  Result Date: 08/14/2020 CLINICAL DATA:  Right hip pain radiating down the leg to the foot. EXAM: MRI LUMBAR SPINE WITHOUT CONTRAST TECHNIQUE: Multiplanar, multisequence MR imaging of the lumbar spine was performed. No intravenous contrast was administered. COMPARISON:  Radiography 07/21/2020 FINDINGS: Segmentation:  5 lumbar type vertebral bodies. Alignment: Mild curvature convex to the right with the apex at L2-3. 1 mm degenerative anterolisthesis L3-4. Vertebrae:  No fracture or primary bone lesion. Conus medullaris and cauda equina: Conus extends to the L1-2 level. Conus and cauda equina appear normal. Paraspinal and other soft tissues: Negative Disc  levels: No abnormality at L2-3 or above. L3-4: Mild bulging of the disc. Mild facet and ligamentous hypertrophy. Mild narrowing of the lateral recesses without visible neural compression. L4-5: Broad-based disc herniation with caudal migration. Extruded fragment on the right measuring 7 mm. Facet and ligamentous hypertrophy at this level. Multifactorial spinal stenosis at the disc level that could cause neural compression on either or both sides. Particular potential for compression of the right L5 nerve because of the right-sided caudally migrated fragment. L5-S1: Chronic disc degeneration with loss of disc height. Minimal bulging of the disc. No canal or foraminal stenosis. IMPRESSION: 1. L4-5: Broad-based disc herniation with caudal migration of a free fragment measuring 7 mm to the right of midline. Facet and ligamentous hypertrophy. Multifactorial spinal stenosis that could cause neural compression on either or both sides. Particular potential for compression of the right L5 nerve because of the caudally migrated fragment. 2. L3-4: Disc bulge. Mild facet and ligamentous hypertrophy. Mild narrowing of the lateral recesses without visible neural compression. Findings at this level could contribute to back pain. Electronically Signed   By: Paulina Fusi M.D.   On: 08/14/2020 05:22     PMFS History: Current Outpatient Medications on File Prior to Visit  Medication Sig Dispense Refill  . ibandronate (BONIVA) 150 MG tablet Take 150 mg by mouth every 30 (thirty) days. Take in the morning with a full glass of water, on an empty stomach, and do not take anything else by mouth or lie down for the next 30 min.     . meloxicam (MOBIC) 15 MG tablet Take 15 mg by mouth daily.      . Multiple Vitamin (MULTIVITAMIN) tablet Take 1 tablet by mouth daily.      . Olmesartan Medoxomil (BENICAR PO) Take by mouth.      . OMEPRAZOLE PO Take by mouth.      . rosuvastatin (CRESTOR) 10 MG tablet Take 10 mg by mouth daily.         No current facility-administered medications on file prior to visit.    Patient Active Problem List   Diagnosis Date Noted  . Lumbar herniated disc 08/18/2020  . Lumbago with sciatica, right side 07/21/2020  . Degenerative disc disease, lumbar 07/21/2020  . Calcification of abdominal aorta (HCC) 07/21/2020  . Osteopenia    Past Medical History:  Diagnosis Date  . Elevated cholesterol   . Hypertension   . Osteopenia     Family History  Problem Relation Age of Onset  . Cancer Mother        Bladder cancer  . Osteoporosis Mother   . Colon cancer Father   . Hypertension Father   . Cancer Maternal Grandmother   . Breast cancer Cousin        Mat. 1st cousins  . Cancer Maternal Uncle     Past Surgical History:  Procedure  Laterality Date  . APPENDECTOMY    . BREAST SURGERY     Breast mass  . TONSILLECTOMY AND ADENOIDECTOMY    . TUBAL LIGATION    . WRIST SURGERY     Cyst   Social History   Occupational History  . Not on file  Tobacco Use  . Smoking status: Former Smoker  Substance and Sexual Activity  . Alcohol use: No  . Drug use: Not on file  . Sexual activity: Yes    Birth control/protection: Post-menopausal

## 2020-08-19 DIAGNOSIS — M25551 Pain in right hip: Secondary | ICD-10-CM | POA: Diagnosis not present

## 2020-08-19 DIAGNOSIS — M5416 Radiculopathy, lumbar region: Secondary | ICD-10-CM | POA: Diagnosis not present

## 2020-08-19 DIAGNOSIS — M2560 Stiffness of unspecified joint, not elsewhere classified: Secondary | ICD-10-CM | POA: Diagnosis not present

## 2020-08-30 ENCOUNTER — Telehealth: Payer: Self-pay | Admitting: Orthopaedic Surgery

## 2020-08-30 NOTE — Telephone Encounter (Signed)
Please see below.

## 2020-08-31 ENCOUNTER — Other Ambulatory Visit: Payer: BC Managed Care – PPO

## 2020-09-02 ENCOUNTER — Ambulatory Visit
Admission: RE | Admit: 2020-09-02 | Discharge: 2020-09-02 | Disposition: A | Discharge status: Home / Self Care | Payer: BC Managed Care – PPO | Source: Ambulatory Visit | Attending: Orthopaedic Surgery | Admitting: Orthopaedic Surgery

## 2020-09-02 DIAGNOSIS — I700 Atherosclerosis of aorta: Secondary | ICD-10-CM

## 2020-09-02 DIAGNOSIS — Z136 Encounter for screening for cardiovascular disorders: Secondary | ICD-10-CM | POA: Diagnosis not present

## 2020-09-11 DIAGNOSIS — J00 Acute nasopharyngitis [common cold]: Secondary | ICD-10-CM | POA: Diagnosis not present

## 2020-09-14 ENCOUNTER — Encounter: Payer: Self-pay | Admitting: Orthopaedic Surgery

## 2020-09-14 ENCOUNTER — Ambulatory Visit (INDEPENDENT_AMBULATORY_CARE_PROVIDER_SITE_OTHER): Payer: PPO | Admitting: Orthopaedic Surgery

## 2020-09-14 ENCOUNTER — Other Ambulatory Visit: Payer: Self-pay

## 2020-09-14 VITALS — Ht 62.0 in | Wt 148.0 lb

## 2020-09-14 DIAGNOSIS — M25551 Pain in right hip: Secondary | ICD-10-CM | POA: Diagnosis not present

## 2020-09-14 DIAGNOSIS — M5126 Other intervertebral disc displacement, lumbar region: Secondary | ICD-10-CM | POA: Diagnosis not present

## 2020-09-14 MED ORDER — BUPIVACAINE HCL 0.5 % IJ SOLN
2.0000 mL | INTRAMUSCULAR | Status: AC | PRN
  Administered 2020-09-14: 2 mL via INTRA_ARTICULAR

## 2020-09-14 MED ORDER — LIDOCAINE HCL 1 % IJ SOLN
2.0000 mL | INTRAMUSCULAR | Status: AC | PRN
  Administered 2020-09-14: 2 mL

## 2020-09-14 NOTE — Progress Notes (Signed)
Office Visit Note   Patient: Ann Fox           Date of Birth: Dec 04, 1940           MRN: [id number] Visit Date: 09/14/2020              Requested by: Hadley Pen, MD [address] [place],  [place] [address] PCP: Hadley Pen, MD   Assessment & Plan: Visit Diagnoses:  1. Lumbar herniated disc   2. Pain in right hip     Plan: Ann Fox was seen recently for evaluation of back pain and right lower extremity discomfort.  She had an MRI scan that revealed protruding disc with free fragment on the right.  She notes that she is feeling much better and didn't want to consider any further treatment.  We also performed an ultrasound of her aorta and she is here for the results.  There is no evidence of an aneurysm.  She is experiencing pain directly over the greater trochanter of her right hip and I will inject that with cortisone  Follow-Up Instructions: Return if symptoms worsen or fail to improve.   Orders:  Orders Placed This Encounter  Procedures  . Large Joint Inj: R greater trochanter   No orders of the defined types were placed in this encounter.     Procedures: Large Joint Inj: R greater trochanter on 09/14/2020 3:52 PM Indications: pain and diagnostic evaluation Details: 25 G 1.5 in needle  Arthrogram: No  Medications: 2 mL lidocaine 1 %; 2 mL bupivacaine 0.5 %  2 mL betamethasone Procedure, treatment alternatives, risks and benefits explained, specific risks discussed. Consent was given by the patient. Immediately prior to procedure a time out was called to verify the correct patient, procedure, equipment, support staff and site/side marked as required. Patient was prepped and draped in the usual sterile fashion.       Clinical Data: No additional findings.   Subjective: Chief Complaint  Patient presents with  . Lower Back - Follow-up    Ultrasound review  Patient presents today for follow up for test results. She had an ultrasound  of her aorta on 09/02/2020.  HPI  Review of Systems   Objective: Vital Signs: Ht 5\' 2"  (1.575 m)   Wt 148 lb (67.1 kg)   BMI 27.07 kg/m   Physical Exam Constitutional:      Appearance: She is well-developed.  Eyes:     Pupils: Pupils are equal, round, and reactive to light.  Pulmonary:     Effort: Pulmonary effort is normal.  Skin:    General: Skin is warm and dry.  Neurological:     Mental Status: She is alert and oriented to person, place, and time.  Psychiatric:        Behavior: Behavior normal.     Ortho Exam awake alert and oriented x3.  Comfortable sitting.  Straight leg raise negative.  Has pain directly over the greater trochanter of her right hip.  No pain with rotation of her hip.  Specialty Comments:  No specialty comments available.  Imaging: No results found.   PMFS History: Patient Active Problem List   Diagnosis Date Noted  . Pain in right hip 09/14/2020  . Lumbar herniated disc 08/18/2020  . Lumbago with sciatica, right side 07/21/2020  . Degenerative disc disease, lumbar 07/21/2020  . Calcification of abdominal aorta (HCC) 07/21/2020  . Osteopenia    Past Medical History:  Diagnosis Date  .  Elevated cholesterol   . Hypertension   . Osteopenia     Family History  Problem Relation Age of Onset  . Cancer Mother        Bladder cancer  . Osteoporosis Mother   . Colon cancer Father   . Hypertension Father   . Cancer Maternal Grandmother   . Breast cancer Cousin        Mat. 1st cousins  . Cancer Maternal Uncle     Past Surgical History:  Procedure Laterality Date  . APPENDECTOMY    . BREAST SURGERY     Breast mass  . TONSILLECTOMY AND ADENOIDECTOMY    . TUBAL LIGATION    . WRIST SURGERY     Cyst   Social History   Occupational History  . Not on file  Tobacco Use  . Smoking status: Former Smoker  Substance and Sexual Activity  . Alcohol use: No  . Drug use: Not on file  . Sexual activity: Yes    Birth control/protection:  Post-menopausal

## 2020-09-19 DIAGNOSIS — Z20822 Contact with and (suspected) exposure to covid-19: Secondary | ICD-10-CM | POA: Diagnosis not present

## 2020-09-19 DIAGNOSIS — J019 Acute sinusitis, unspecified: Secondary | ICD-10-CM | POA: Diagnosis not present

## 2020-09-21 DIAGNOSIS — R0981 Nasal congestion: Secondary | ICD-10-CM | POA: Diagnosis not present

## 2020-09-21 DIAGNOSIS — Z20822 Contact with and (suspected) exposure to covid-19: Secondary | ICD-10-CM | POA: Diagnosis not present

## 2020-09-21 DIAGNOSIS — U071 COVID-19: Secondary | ICD-10-CM | POA: Diagnosis not present

## 2020-09-21 DIAGNOSIS — R058 Other specified cough: Secondary | ICD-10-CM | POA: Diagnosis not present

## 2020-12-14 DIAGNOSIS — K219 Gastro-esophageal reflux disease without esophagitis: Secondary | ICD-10-CM | POA: Diagnosis not present

## 2020-12-14 DIAGNOSIS — K5730 Diverticulosis of large intestine without perforation or abscess without bleeding: Secondary | ICD-10-CM | POA: Diagnosis not present

## 2020-12-14 DIAGNOSIS — K648 Other hemorrhoids: Secondary | ICD-10-CM | POA: Diagnosis not present

## 2020-12-20 DIAGNOSIS — Z86010 Personal history of colonic polyps: Secondary | ICD-10-CM | POA: Diagnosis not present

## 2020-12-20 DIAGNOSIS — Z79899 Other long term (current) drug therapy: Secondary | ICD-10-CM | POA: Diagnosis not present

## 2020-12-20 DIAGNOSIS — K644 Residual hemorrhoidal skin tags: Secondary | ICD-10-CM | POA: Diagnosis not present

## 2020-12-20 DIAGNOSIS — Z800 Family history of malignant neoplasm of digestive organs: Secondary | ICD-10-CM | POA: Diagnosis not present

## 2020-12-20 DIAGNOSIS — I10 Essential (primary) hypertension: Secondary | ICD-10-CM | POA: Diagnosis not present

## 2020-12-20 DIAGNOSIS — Z7982 Long term (current) use of aspirin: Secondary | ICD-10-CM | POA: Diagnosis not present

## 2020-12-20 DIAGNOSIS — K648 Other hemorrhoids: Secondary | ICD-10-CM | POA: Diagnosis not present

## 2020-12-20 DIAGNOSIS — K5730 Diverticulosis of large intestine without perforation or abscess without bleeding: Secondary | ICD-10-CM | POA: Diagnosis not present

## 2020-12-20 DIAGNOSIS — Z1211 Encounter for screening for malignant neoplasm of colon: Secondary | ICD-10-CM | POA: Diagnosis not present

## 2021-03-24 DIAGNOSIS — R051 Acute cough: Secondary | ICD-10-CM | POA: Diagnosis not present

## 2021-03-24 DIAGNOSIS — Z20828 Contact with and (suspected) exposure to other viral communicable diseases: Secondary | ICD-10-CM | POA: Diagnosis not present

## 2021-03-24 DIAGNOSIS — J069 Acute upper respiratory infection, unspecified: Secondary | ICD-10-CM | POA: Diagnosis not present

## 2021-03-27 DIAGNOSIS — J40 Bronchitis, not specified as acute or chronic: Secondary | ICD-10-CM | POA: Diagnosis not present

## 2021-04-24 DIAGNOSIS — L718 Other rosacea: Secondary | ICD-10-CM | POA: Diagnosis not present

## 2021-08-21 DIAGNOSIS — I10 Essential (primary) hypertension: Secondary | ICD-10-CM | POA: Diagnosis not present

## 2021-08-21 DIAGNOSIS — E782 Mixed hyperlipidemia: Secondary | ICD-10-CM | POA: Diagnosis not present

## 2021-08-21 DIAGNOSIS — M159 Polyosteoarthritis, unspecified: Secondary | ICD-10-CM | POA: Diagnosis not present

## 2021-08-21 DIAGNOSIS — J0100 Acute maxillary sinusitis, unspecified: Secondary | ICD-10-CM | POA: Diagnosis not present

## 2021-08-21 DIAGNOSIS — Z0000 Encounter for general adult medical examination without abnormal findings: Secondary | ICD-10-CM | POA: Diagnosis not present

## 2021-08-21 DIAGNOSIS — K219 Gastro-esophageal reflux disease without esophagitis: Secondary | ICD-10-CM | POA: Diagnosis not present

## 2021-08-21 DIAGNOSIS — Z23 Encounter for immunization: Secondary | ICD-10-CM | POA: Diagnosis not present

## 2021-10-09 DIAGNOSIS — I10 Essential (primary) hypertension: Secondary | ICD-10-CM | POA: Diagnosis not present

## 2021-10-09 DIAGNOSIS — G459 Transient cerebral ischemic attack, unspecified: Secondary | ICD-10-CM | POA: Diagnosis not present

## 2021-10-09 DIAGNOSIS — R202 Paresthesia of skin: Secondary | ICD-10-CM | POA: Diagnosis not present

## 2021-10-09 DIAGNOSIS — R0902 Hypoxemia: Secondary | ICD-10-CM | POA: Diagnosis not present

## 2021-10-09 DIAGNOSIS — R42 Dizziness and giddiness: Secondary | ICD-10-CM | POA: Diagnosis not present

## 2021-10-09 DIAGNOSIS — R0789 Other chest pain: Secondary | ICD-10-CM | POA: Diagnosis not present

## 2021-10-09 DIAGNOSIS — I161 Hypertensive emergency: Secondary | ICD-10-CM | POA: Diagnosis not present

## 2021-10-09 DIAGNOSIS — R079 Chest pain, unspecified: Secondary | ICD-10-CM | POA: Diagnosis not present

## 2021-10-10 DIAGNOSIS — G459 Transient cerebral ischemic attack, unspecified: Secondary | ICD-10-CM | POA: Diagnosis not present

## 2021-10-10 DIAGNOSIS — M1990 Unspecified osteoarthritis, unspecified site: Secondary | ICD-10-CM | POA: Diagnosis not present

## 2021-10-10 DIAGNOSIS — R220 Localized swelling, mass and lump, head: Secondary | ICD-10-CM | POA: Diagnosis not present

## 2021-10-10 DIAGNOSIS — G930 Cerebral cysts: Secondary | ICD-10-CM | POA: Diagnosis not present

## 2021-10-10 DIAGNOSIS — I160 Hypertensive urgency: Secondary | ICD-10-CM | POA: Diagnosis not present

## 2021-10-10 DIAGNOSIS — I639 Cerebral infarction, unspecified: Secondary | ICD-10-CM | POA: Diagnosis not present

## 2021-10-10 DIAGNOSIS — I6782 Cerebral ischemia: Secondary | ICD-10-CM | POA: Diagnosis not present

## 2021-10-11 DIAGNOSIS — I160 Hypertensive urgency: Secondary | ICD-10-CM | POA: Diagnosis not present

## 2021-10-11 DIAGNOSIS — G459 Transient cerebral ischemic attack, unspecified: Secondary | ICD-10-CM | POA: Diagnosis not present

## 2021-10-11 DIAGNOSIS — M1990 Unspecified osteoarthritis, unspecified site: Secondary | ICD-10-CM | POA: Diagnosis not present

## 2021-10-18 DIAGNOSIS — I10 Essential (primary) hypertension: Secondary | ICD-10-CM | POA: Diagnosis not present

## 2022-01-30 ENCOUNTER — Ambulatory Visit (INDEPENDENT_AMBULATORY_CARE_PROVIDER_SITE_OTHER): Payer: PPO | Admitting: Orthopaedic Surgery

## 2022-01-30 ENCOUNTER — Encounter: Payer: Self-pay | Admitting: Orthopaedic Surgery

## 2022-01-30 ENCOUNTER — Other Ambulatory Visit: Payer: Self-pay

## 2022-01-30 ENCOUNTER — Ambulatory Visit (INDEPENDENT_AMBULATORY_CARE_PROVIDER_SITE_OTHER): Payer: PPO

## 2022-01-30 VITALS — Ht 62.0 in | Wt 155.0 lb

## 2022-01-30 DIAGNOSIS — M5459 Other low back pain: Secondary | ICD-10-CM

## 2022-01-30 DIAGNOSIS — G8929 Other chronic pain: Secondary | ICD-10-CM | POA: Diagnosis not present

## 2022-01-30 DIAGNOSIS — M25512 Pain in left shoulder: Secondary | ICD-10-CM | POA: Diagnosis not present

## 2022-01-30 DIAGNOSIS — M5441 Lumbago with sciatica, right side: Secondary | ICD-10-CM | POA: Diagnosis not present

## 2022-01-30 MED ORDER — LIDOCAINE HCL 1 % IJ SOLN
2.0000 mL | INTRAMUSCULAR | Status: AC | PRN
  Administered 2022-01-30: 2 mL

## 2022-01-30 MED ORDER — METHYLPREDNISOLONE ACETATE 40 MG/ML IJ SUSP
80.0000 mg | INTRAMUSCULAR | Status: AC | PRN
  Administered 2022-01-30: 80 mg via INTRA_ARTICULAR

## 2022-01-30 NOTE — Progress Notes (Signed)
? ?Office Visit Note ?  ?Patient: Ann Fox           ?Date of Birth: [birth year]           ?MRN: [id number] ?Visit Date: 01/30/2022 ?             ?Requested by: Ann Pen, MD ?[address] ?[place],  [place] [address] ?PCP: Ann Pen, MD ? ? ?Assessment & Plan: ?Visit Diagnoses: Left shoulder pain.  Bilateral posterior back pain ? ?Plan: Ann Fox comes in today complaining of left shoulder pain times a few months.  She denies any specific injury but could have been related to putting holiday decorations away.  She denies any numbing or paresthesias in her left arm.  It makes it difficult for her to sleep at night.  She uses Tylenol 3 times a day but also says this is for hip pain.  She points to her posterior buttocks bilaterally.  She denies any paresthesias any loss of bowel or bladder control.  She has had epidural injections in the past and had an MRI of her lumbar spine in 2021 which demonstrated canal stenosis which coordinated with her history of having increased pain with ambulation.  Left shoulder exam was consistent with rotator cuff tendinosis discussed an injection today and she would like to go forward with that.  With regards to her lumbar spine we did put in a referral to Ann Fox for possible epidural steroid injections which she has had in the past ? ?Follow-Up Instructions: No follow-ups on file.  ? ?Orders:  ?No orders of the defined types were placed in this encounter. ? ?No orders of the defined types were placed in this encounter. ? ? ? ? Procedures: ?Large Joint Inj: L subacromial bursa on 01/30/2022 4:30 PM ?Indications: diagnostic evaluation and pain ?Details: 25 G 1.5 in needle, anterior approach ? ?Arthrogram: No ? ?Medications: 2 mL lidocaine 1 %; 80 mg methylPREDNISolone acetate 40 MG/ML ?Outcome: tolerated well, no immediate complications ?Procedure, treatment alternatives, risks and benefits explained, specific risks discussed. Consent was given by the  patient.  ? ? ? ? ?Clinical Data: ?No additional findings. ? ? ?Subjective: ?Chief Complaint  ?Patient presents with  ? Left Shoulder - Pain  ?Patient presents today for left shoulder pain. She said that it has been hurting since December of 2022. No known injury. She has decreased range of motion. She cannot lift with that arm. She is taking Tylenol as needed. She is right hand dominant.  ? ? ? ?Review of Systems  ?All other systems reviewed and are negative. ? ? ?Objective: ?Vital Signs: There were no vitals taken for this visit. ? ?Physical Exam ?Constitutional:   ?   Appearance: Normal appearance.  ?Pulmonary:  ?   Effort: Pulmonary effort is normal.  ?Neurological:  ?   Mental Status: She is alert.  ?Psychiatric:     ?   Mood and Affect: Mood normal.     ?   Behavior: Behavior normal.  ? ? ?Ortho Exam ?Examination of her left shoulder.  She has no redness no swelling.  Distal grip strength and pulses are intact.  She is able to forward elevate to 170 degrees though it is painful.  Negative drop arm test.  Internal rotation behind her back is also painful.  She has 5 out of 5 strength with resisted external and internal rotation and abduction.  She does have positive impingement findings ?Left lower back she  points to pain over her top of her posterior pelvis bilaterally.  It strength is intact.  She is able to extend and flex her legs wiggle her toes does not seem to have any radicular findings today ?Specialty Comments:  ?No specialty comments available. ? ?Imaging: ?No results found. ? ? ?PMFS History: ?Patient Active Problem List  ? Diagnosis Date Noted  ? Pain in right hip 09/14/2020  ? Lumbar herniated disc 08/18/2020  ? Lumbago with sciatica, right side 07/21/2020  ? Degenerative disc disease, lumbar 07/21/2020  ? Calcification of abdominal aorta (HCC) 07/21/2020  ? Osteopenia   ? ?Past Medical History:  ?Diagnosis Date  ? Elevated cholesterol   ? Hypertension   ? Osteopenia   ?  ?Family History  ?Problem  Relation Age of Onset  ? Cancer Mother   ?     Bladder cancer  ? Osteoporosis Mother   ? Colon cancer Father   ? Hypertension Father   ? Cancer Maternal Grandmother   ? Breast cancer Cousin   ?     Mat. 1st cousins  ? Cancer Maternal Uncle   ?  ?Past Surgical History:  ?Procedure Laterality Date  ? APPENDECTOMY    ? BREAST SURGERY    ? Breast mass  ? TONSILLECTOMY AND ADENOIDECTOMY    ? TUBAL LIGATION    ? WRIST SURGERY    ? Cyst  ? ?Social History  ? ?Occupational History  ? Not on file  ?Tobacco Use  ? Smoking status: Former  ? Smokeless tobacco: Not on file  ?Substance and Sexual Activity  ? Alcohol use: No  ? Drug use: Not on file  ? Sexual activity: Yes  ?  Birth control/protection: Post-menopausal  ? ? ? ? ? ? ?

## 2022-03-01 ENCOUNTER — Encounter: Payer: PPO | Admitting: Physical Medicine and Rehabilitation

## 2022-03-14 ENCOUNTER — Telehealth: Payer: Self-pay | Admitting: Physical Medicine and Rehabilitation

## 2022-03-14 NOTE — Telephone Encounter (Signed)
Patient called for an appointment with Dr. Alvester Morin. Her call back number is [phone] ?

## 2022-03-26 ENCOUNTER — Telehealth: Payer: Self-pay | Admitting: Orthopaedic Surgery

## 2022-03-26 NOTE — Telephone Encounter (Signed)
Pt called stating per discussion with Dr Durward Fortes asking for referral for MRI. Please call pt at [phone]

## 2022-03-27 ENCOUNTER — Other Ambulatory Visit: Payer: Self-pay

## 2022-03-27 DIAGNOSIS — G8929 Other chronic pain: Secondary | ICD-10-CM

## 2022-03-27 NOTE — Telephone Encounter (Signed)
Left shoulder MRI

## 2022-03-27 NOTE — Telephone Encounter (Signed)
Spoke with patient and ordered MRI. 

## 2022-04-03 ENCOUNTER — Telehealth: Payer: Self-pay | Admitting: Orthopaedic Surgery

## 2022-04-03 NOTE — Telephone Encounter (Signed)
Called pt 1X and left vm for pt to call and set an MRI review appt with Dr. Durward Fortes after 04/08/22

## 2022-04-08 ENCOUNTER — Ambulatory Visit
Admission: RE | Admit: 2022-04-08 | Discharge: 2022-04-08 | Disposition: A | Discharge status: Home / Self Care | Payer: PPO | Source: Ambulatory Visit | Attending: Orthopaedic Surgery | Admitting: Orthopaedic Surgery

## 2022-04-08 DIAGNOSIS — G8929 Other chronic pain: Secondary | ICD-10-CM

## 2022-04-11 ENCOUNTER — Ambulatory Visit: Payer: PPO | Admitting: Physician Assistant

## 2022-04-16 ENCOUNTER — Telehealth: Payer: Self-pay | Admitting: Physical Medicine and Rehabilitation

## 2022-04-16 NOTE — Telephone Encounter (Signed)
Patient called asked if her husband can get an injection in his back when she comes for hers tomorrow? The number to contact patient is [phone]

## 2022-04-17 ENCOUNTER — Ambulatory Visit: Payer: Self-pay

## 2022-04-17 ENCOUNTER — Ambulatory Visit (INDEPENDENT_AMBULATORY_CARE_PROVIDER_SITE_OTHER): Payer: PPO | Admitting: Physical Medicine and Rehabilitation

## 2022-04-17 ENCOUNTER — Encounter: Payer: Self-pay | Admitting: Physical Medicine and Rehabilitation

## 2022-04-17 VITALS — BP 137/85 | HR 77

## 2022-04-17 DIAGNOSIS — M5416 Radiculopathy, lumbar region: Secondary | ICD-10-CM

## 2022-04-17 MED ORDER — METHYLPREDNISOLONE ACETATE 80 MG/ML IJ SUSP
80.0000 mg | Freq: Once | INTRAMUSCULAR | Status: AC
  Administered 2022-04-17: 80 mg

## 2022-04-17 NOTE — Procedures (Unsigned)
Lumbosacral Transforaminal Epidural Steroid Injection - Sub-Pedicular Approach with Fluoroscopic Guidance  Patient: Ann Fox      Date of Birth: November 29, 1940 MRN: [id number] PCP: Hadley Pen, MD      Visit Date: 04/17/2022   Universal Protocol:    Date/Time: 04/17/2022  Consent Given By: the patient  Position: PRONE  Additional Comments: Vital signs were monitored before and after the procedure. Patient was prepped and draped in the usual sterile fashion. The correct patient, procedure, and site was verified.   Injection Procedure Details:   Procedure diagnoses: Lumbar radiculopathy [M54.16]    Meds Administered:  Meds ordered this encounter  Medications   methylPREDNISolone acetate (DEPO-MEDROL) injection 80 mg    Laterality: Right  Location/Site: L5  Needle:5.0 in., 22 ga.  Short bevel or Quincke spinal needle  Needle Placement: Transforaminal  Findings:    -Comments: Excellent flow of contrast along the nerve, nerve root and into the epidural space.  Procedure Details: After squaring off the end-plates to get a true AP view, the C-arm was positioned so that an oblique view of the foramen as noted above was visualized. The target area is just inferior to the "nose of the scotty dog" or sub pedicular. The soft tissues overlying this structure were infiltrated with 2-3 ml. of 1% Lidocaine without Epinephrine.  The spinal needle was inserted toward the target using a "trajectory" view along the fluoroscope beam.  Under AP and lateral visualization, the needle was advanced so it did not puncture dura and was located close the 6 O'Clock position of the pedical in AP tracterory. Biplanar projections were used to confirm position. Aspiration was confirmed to be negative for CSF and/or blood. A 1-2 ml. volume of Isovue-250 was injected and flow of contrast was noted at each level. Radiographs were obtained for documentation purposes.   After attaining the desired  flow of contrast documented above, a 0.5 to 1.0 ml test dose of 0.25% Marcaine was injected into each respective transforaminal space.  The patient was observed for 90 seconds post injection.  After no sensory deficits were reported, and normal lower extremity motor function was noted,   the above injectate was administered so that equal amounts of the injectate were placed at each foramen (level) into the transforaminal epidural space.   Additional Comments:  The patient tolerated the procedure well Dressing: 2 x 2 sterile gauze and Band-Aid    Post-procedure details: Patient was observed during the procedure. Post-procedure instructions were reviewed.  Patient left the clinic in stable condition.

## 2022-04-17 NOTE — Progress Notes (Unsigned)
Ann Fox - 81 y.o. female MRN [id number]  Date of birth: 1941/07/22  Office Visit Note: Visit Date: 04/17/2022 PCP: Hadley Pen, MD Referred by: Hadley Pen, MD  Subjective: Chief Complaint  Patient presents with   Lower Back - Pain   Right Hip - Pain   Left Hip - Pain   HPI:  Ann Fox is a 81 y.o. female who comes in today at the request of Dr. Norlene Campbell for planned Right L5-S1 Lumbar Transforaminal epidural steroid injection with fluoroscopic guidance.  The patient has failed conservative care including home exercise, medications, time and activity modification.  This injection will be diagnostic and hopefully therapeutic.  Please see requesting physician notes for further details and justification.   ROS Otherwise per HPI.  Assessment & Plan: Visit Diagnoses:    ICD-10-CM   1. Lumbar radiculopathy  M54.16 XR C-ARM NO REPORT    Epidural Steroid injection    methylPREDNISolone acetate (DEPO-MEDROL) injection 80 mg      Plan: No additional findings.   Meds & Orders:  Meds ordered this encounter  Medications   methylPREDNISolone acetate (DEPO-MEDROL) injection 80 mg    Orders Placed This Encounter  Procedures   XR C-ARM NO REPORT   Epidural Steroid injection    Follow-up: Return if symptoms worsen or fail to improve.   Procedures: No procedures performed  Lumbosacral Transforaminal Epidural Steroid Injection - Sub-Pedicular Approach with Fluoroscopic Guidance  Patient: Ann Fox      Date of Birth: 20-Jun-1941 MRN: [id number] PCP: Hadley Pen, MD      Visit Date: 04/17/2022   Universal Protocol:    Date/Time: 04/17/2022  Consent Given By: the patient  Position: PRONE  Additional Comments: Vital signs were monitored before and after the procedure. Patient was prepped and draped in the usual sterile fashion. The correct patient, procedure, and site was verified.   Injection Procedure Details:   Procedure diagnoses:  Lumbar radiculopathy [M54.16]    Meds Administered:  Meds ordered this encounter  Medications   methylPREDNISolone acetate (DEPO-MEDROL) injection 80 mg    Laterality: Right  Location/Site: L5  Needle:5.0 in., 22 ga.  Short bevel or Quincke spinal needle  Needle Placement: Transforaminal  Findings:    -Comments: Excellent flow of contrast along the nerve, nerve root and into the epidural space.  Procedure Details: After squaring off the end-plates to get a true AP view, the C-arm was positioned so that an oblique view of the foramen as noted above was visualized. The target area is just inferior to the "nose of the scotty dog" or sub pedicular. The soft tissues overlying this structure were infiltrated with 2-3 ml. of 1% Lidocaine without Epinephrine.  The spinal needle was inserted toward the target using a "trajectory" view along the fluoroscope beam.  Under AP and lateral visualization, the needle was advanced so it did not puncture dura and was located close the 6 O'Clock position of the pedical in AP tracterory. Biplanar projections were used to confirm position. Aspiration was confirmed to be negative for CSF and/or blood. A 1-2 ml. volume of Isovue-250 was injected and flow of contrast was noted at each level. Radiographs were obtained for documentation purposes.   After attaining the desired flow of contrast documented above, a 0.5 to 1.0 ml test dose of 0.25% Marcaine was injected into each respective transforaminal space.  The patient was observed for 90 seconds post injection.  After no sensory deficits were reported, and  normal lower extremity motor function was noted,   the above injectate was administered so that equal amounts of the injectate were placed at each foramen (level) into the transforaminal epidural space.   Additional Comments:  The patient tolerated the procedure well Dressing: 2 x 2 sterile gauze and Band-Aid    Post-procedure details: Patient was  observed during the procedure. Post-procedure instructions were reviewed.  Patient left the clinic in stable condition.    Clinical History: MRI LUMBAR SPINE WITHOUT CONTRAST   TECHNIQUE: Multiplanar, multisequence MR imaging of the lumbar spine was performed. No intravenous contrast was administered.   COMPARISON:  Radiography 07/21/2020   FINDINGS: Segmentation:  5 lumbar type vertebral bodies.   Alignment: Mild curvature convex to the right with the apex at L2-3. 1 mm degenerative anterolisthesis L3-4.   Vertebrae:  No fracture or primary bone lesion.   Conus medullaris and cauda equina: Conus extends to the L1-2 level. Conus and cauda equina appear normal.   Paraspinal and other soft tissues: Negative   Disc levels:   No abnormality at L2-3 or above.   L3-4: Mild bulging of the disc. Mild facet and ligamentous hypertrophy. Mild narrowing of the lateral recesses without visible neural compression.   L4-5: Broad-based disc herniation with caudal migration. Extruded fragment on the right measuring 7 mm. Facet and ligamentous hypertrophy at this level. Multifactorial spinal stenosis at the disc level that could cause neural compression on either or both sides. Particular potential for compression of the right L5 nerve because of the right-sided caudally migrated fragment.   L5-S1: Chronic disc degeneration with loss of disc height. Minimal bulging of the disc. No canal or foraminal stenosis.   IMPRESSION: 1. L4-5: Broad-based disc herniation with caudal migration of a free fragment measuring 7 mm to the right of midline. Facet and ligamentous hypertrophy. Multifactorial spinal stenosis that could cause neural compression on either or both sides. Particular potential for compression of the right L5 nerve because of the caudally migrated fragment. 2. L3-4: Disc bulge. Mild facet and ligamentous hypertrophy. Mild narrowing of the lateral recesses without visible  neural compression. Findings at this level could contribute to back pain.     Electronically Signed   By: Paulina Fusi M.D.   On: 08/14/2020 05:22     Objective:  VS:  HT:    WT:   BMI:     BP:   HR: bpm  TEMP: ( )  RESP:  Physical Exam Vitals and nursing note reviewed.  Constitutional:      General: She is not in acute distress.    Appearance: Normal appearance. She is not ill-appearing.  HENT:     Head: Normocephalic and atraumatic.     Right Ear: External ear normal.     Left Ear: External ear normal.  Eyes:     Extraocular Movements: Extraocular movements intact.  Cardiovascular:     Rate and Rhythm: Normal rate.     Pulses: Normal pulses.  Pulmonary:     Effort: Pulmonary effort is normal. No respiratory distress.  Abdominal:     General: There is no distension.     Palpations: Abdomen is soft.  Musculoskeletal:        General: Tenderness present.     Cervical back: Neck supple.     Right lower leg: No edema.     Left lower leg: No edema.     Comments: Patient has good distal strength with no pain over the greater trochanters.  No  clonus or focal weakness.  Skin:    Findings: No erythema, lesion or rash.  Neurological:     General: No focal deficit present.     Mental Status: She is alert and oriented to person, place, and time.     Sensory: No sensory deficit.     Motor: No weakness or abnormal muscle tone.     Coordination: Coordination normal.  Psychiatric:        Mood and Affect: Mood normal.        Behavior: Behavior normal.      Imaging: No results found.

## 2022-04-17 NOTE — Progress Notes (Unsigned)
Pt state lower back pain that travels to her right buttock and both hips. Pt state standing and walking makes the pain worse. Pt state she take over the counter pain meds to help ease her pain.  Numeric Pain Rating Scale and Functional Assessment Average Pain 4   In the last MONTH (on 0-10 scale) has pain interfered with the following?  1. General activity like being  able to carry out your everyday physical activities such as walking, climbing stairs, carrying groceries, or moving a chair?  Rating(8)   +Driver, -BT, -Dye Allergies.

## 2022-04-17 NOTE — Patient Instructions (Signed)

## 2022-04-24 ENCOUNTER — Ambulatory Visit: Payer: PPO | Admitting: Orthopaedic Surgery

## 2022-05-01 ENCOUNTER — Ambulatory Visit (INDEPENDENT_AMBULATORY_CARE_PROVIDER_SITE_OTHER): Payer: PPO | Admitting: Orthopaedic Surgery

## 2022-05-01 ENCOUNTER — Encounter: Payer: Self-pay | Admitting: Orthopaedic Surgery

## 2022-05-01 DIAGNOSIS — G8929 Other chronic pain: Secondary | ICD-10-CM

## 2022-05-01 DIAGNOSIS — M25512 Pain in left shoulder: Secondary | ICD-10-CM

## 2022-05-01 MED ORDER — METHYLPREDNISOLONE ACETATE 40 MG/ML IJ SUSP
80.0000 mg | INTRAMUSCULAR | Status: AC | PRN
  Administered 2022-05-01: 80 mg via INTRA_ARTICULAR

## 2022-05-01 MED ORDER — BUPIVACAINE HCL 0.25 % IJ SOLN
2.0000 mL | INTRAMUSCULAR | Status: AC | PRN
  Administered 2022-05-01: 2 mL via INTRA_ARTICULAR

## 2022-05-01 MED ORDER — LIDOCAINE HCL 1 % IJ SOLN
2.0000 mL | INTRAMUSCULAR | Status: AC | PRN
  Administered 2022-05-01: 2 mL

## 2022-10-03 ENCOUNTER — Ambulatory Visit (INDEPENDENT_AMBULATORY_CARE_PROVIDER_SITE_OTHER): Payer: PPO

## 2022-10-03 ENCOUNTER — Ambulatory Visit (INDEPENDENT_AMBULATORY_CARE_PROVIDER_SITE_OTHER): Payer: PPO | Admitting: Orthopaedic Surgery

## 2022-10-03 ENCOUNTER — Encounter: Payer: Self-pay | Admitting: Orthopaedic Surgery

## 2022-10-03 DIAGNOSIS — M25511 Pain in right shoulder: Secondary | ICD-10-CM

## 2022-10-03 MED ORDER — METHYLPREDNISOLONE ACETATE 40 MG/ML IJ SUSP
80.0000 mg | INTRAMUSCULAR | Status: AC | PRN
  Administered 2022-10-03: 80 mg via INTRA_ARTICULAR

## 2022-10-03 MED ORDER — BUPIVACAINE HCL 0.25 % IJ SOLN
2.0000 mL | INTRAMUSCULAR | Status: AC | PRN
  Administered 2022-10-03: 2 mL via INTRA_ARTICULAR

## 2022-10-03 MED ORDER — LIDOCAINE HCL 1 % IJ SOLN
2.0000 mL | INTRAMUSCULAR | Status: AC | PRN
  Administered 2022-10-03: 2 mL

## 2022-10-03 NOTE — Progress Notes (Signed)
Office Visit Note   Patient: Ann Fox           Date of Birth: 21-Apr-1941           MRN: [id number] Visit Date: 10/03/2022              Requested by: Hadley Pen, MD [address] [place],  [place] [address] PCP: Hadley Pen, MD   Assessment & Plan: Visit Diagnoses:  1. Acute pain of right shoulder     Plan: Ms. Palazzo is a pleasant 81 year old woman with a 50-month history of right shoulder pain.  She denies any specific injury but has been participating in downsizing and moving.  She does have a history of arthritis in her left shoulder and had a glenohumeral injection before which seemed to help .  Denies any neck pain.  Denies any paresthesias into her arm.  Her examination does show some stiffness and pain with forward elevation and external rotation.  Internal rotation behind her back is quite difficult.  X-rays demonstrate some inferior osteophyte formation and some joint space narrowing.  Findings today more consistent with arthritis of the glenohumeral joint.  Will go forward with an injection today.  Did tell her that if this did not improve she should contact us and we will order an MRI as there also is a possibility of rotator cuff tear.  Mrs. Follow-Up Instructions: Return if symptoms worsen or fail to improve.   Orders:  Orders Placed This Encounter  Procedures   Large Joint Inj: R glenohumeral   XR Shoulder Right   No orders of the defined types were placed in this encounter.     Procedures: Large Joint Inj: R glenohumeral on 10/03/2022 10:49 AM Indications: diagnostic evaluation and pain Details: 25 G 1.5 in needle, anteromedial approach  Arthrogram: No  Medications: 2 mL lidocaine 1 %; 80 mg methylPREDNISolone acetate 40 MG/ML; 2 mL bupivacaine 0.25 % Outcome: tolerated well, no immediate complications Procedure, treatment alternatives, risks and benefits explained, specific risks discussed. Consent was given by the patient.       Clinical Data: No additional findings.   Subjective: Chief Complaint  Patient presents with   Right Shoulder - Pain    HPI [place] is a pleasant 81 year old woman with a 28-month history of right shoulder pain.  She has pain with range of motion.  She denies any particular injury.  She does have a history of arthritis in her left glenohumeral joint and injection was quite helpful  Review of Systems  All other systems reviewed and are negative.    Objective: Vital Signs: There were no vitals taken for this visit.  Physical Exam Constitutional:      Appearance: Normal appearance.  Pulmonary:     Effort: Pulmonary effort is normal.  Skin:    General: Skin is warm and dry.  Neurological:     General: No focal deficit present.     Mental Status: She is alert.    Ortho Exam Examination she has no neck pain or pain with range of motion of her neck no radicular findings.  Semination of the right shoulder she is focally tender over the glenohumeral joint.  She is able to forward elevate her right arm actively but it is quite painful.  Internal rotation behind her back is also painful.  External rotation is mildly limited and reproduces her pain.  Strength is intact she is neurovascular intact distally Specialty Comments:  MRI LUMBAR  SPINE WITHOUT CONTRAST   TECHNIQUE: Multiplanar, multisequence MR imaging of the lumbar spine was performed. No intravenous contrast was administered.   COMPARISON:  Radiography 07/21/2020   FINDINGS: Segmentation:  5 lumbar type vertebral bodies.   Alignment: Mild curvature convex to the right with the apex at L2-3. 1 mm degenerative anterolisthesis L3-4.   Vertebrae:  No fracture or primary bone lesion.   Conus medullaris and cauda equina: Conus extends to the L1-2 level. Conus and cauda equina appear normal.   Paraspinal and other soft tissues: Negative   Disc levels:   No abnormality at L2-3 or above.   L3-4: Mild bulging of  the disc. Mild facet and ligamentous hypertrophy. Mild narrowing of the lateral recesses without visible neural compression.   L4-5: Broad-based disc herniation with caudal migration. Extruded fragment on the right measuring 7 mm. Facet and ligamentous hypertrophy at this level. Multifactorial spinal stenosis at the disc level that could cause neural compression on either or both sides. Particular potential for compression of the right L5 nerve because of the right-sided caudally migrated fragment.   L5-S1: Chronic disc degeneration with loss of disc height. Minimal bulging of the disc. No canal or foraminal stenosis.   IMPRESSION: 1. L4-5: Broad-based disc herniation with caudal migration of a free fragment measuring 7 mm to the right of midline. Facet and ligamentous hypertrophy. Multifactorial spinal stenosis that could cause neural compression on either or both sides. Particular potential for compression of the right L5 nerve because of the caudally migrated fragment. 2. L3-4: Disc bulge. Mild facet and ligamentous hypertrophy. Mild narrowing of the lateral recesses without visible neural compression. Findings at this level could contribute to back pain.     Electronically Signed   By: Nelson Chimes M.D.   On: 08/14/2020 05:22  Imaging: XR Shoulder Right  Result Date: 10/03/2022 Three-view radiographs of her right shoulder were obtained today.  She has some early sclerotic changes and some mild joint space narrowing of the glenohumeral joint also inferior osteophyte.  No humeral head elevation no acute osseous changes.  Some degenerative change at the [facility] joint    PMFS History: Patient Active Problem List   Diagnosis Date Noted   Pain in right shoulder 10/03/2022   Pain in left shoulder 01/30/2022   Pain in right hip 09/14/2020   Lumbar herniated disc 08/18/2020   Lumbago with sciatica, right side 07/21/2020   Degenerative disc disease, lumbar 07/21/2020    Calcification of abdominal aorta ([place]) 07/21/2020   Osteopenia    Past Medical History:  Diagnosis Date   Elevated cholesterol    Hypertension    Osteopenia     Family History  Problem Relation Age of Onset   Cancer Mother        Bladder cancer   Osteoporosis Mother    Colon cancer Father    Hypertension Father    Cancer Maternal Grandmother    Breast cancer Cousin        Mat. 1st cousins   Cancer Maternal Uncle     Past Surgical History:  Procedure Laterality Date   APPENDECTOMY     BREAST SURGERY     Breast mass   TONSILLECTOMY AND ADENOIDECTOMY     TUBAL LIGATION     WRIST SURGERY     Cyst   Social History   Occupational History   Not on file  Tobacco Use   Smoking status: Former   Smokeless tobacco: Not on file  Substance and Sexual Activity  Alcohol use: No   Drug use: Not on file   Sexual activity: Yes    Birth control/protection: Post-menopausal

## 2022-10-16 ENCOUNTER — Other Ambulatory Visit: Payer: Self-pay | Admitting: Physician Assistant

## 2022-10-16 ENCOUNTER — Telehealth: Payer: Self-pay | Admitting: Orthopaedic Surgery

## 2022-10-16 DIAGNOSIS — M25511 Pain in right shoulder: Secondary | ICD-10-CM

## 2022-10-16 NOTE — Telephone Encounter (Signed)
Patient called asked if she can get an MRI set up to be done on her right shoulder?  The number to contact patient is [phone]

## 2022-11-13 ENCOUNTER — Ambulatory Visit: Payer: PPO | Admitting: Physician Assistant

## 2022-11-13 ENCOUNTER — Telehealth: Payer: Self-pay

## 2022-11-13 NOTE — Telephone Encounter (Signed)
Can you please get this referral in with Dr Marlou Sa next week?

## 2022-11-13 NOTE — Telephone Encounter (Signed)
-----   Message from [place], [place] sent at 11/13/2022  9:02 AM EST ----- Ann Fox. I spoke to [name] this morning she was going to come up and see me to do an MRI review this morning but I just got her MRI and she is got significant rotator cuff pathology and glenohumeral issues.  I spoke to her on the phone and rather than have her come up and see me can we get her into see Dr. Marlou Sa as soon as possible.  I know he is very busy this week so maybe next week?  She is having a lot of pain thank you so much. ----- Message ----- From: H. Rivera Colon Lions Sent: 11/13/2022   8:26 AM EST To: Bevely Palmer Persons, PA  MRI report

## 2022-11-13 NOTE — Telephone Encounter (Signed)
Done appt scheduled for 11/19/22 @ 1:15pm

## 2022-11-19 ENCOUNTER — Encounter: Payer: Self-pay | Admitting: Orthopedic Surgery

## 2022-11-19 ENCOUNTER — Ambulatory Visit (INDEPENDENT_AMBULATORY_CARE_PROVIDER_SITE_OTHER): Payer: PPO | Admitting: Orthopedic Surgery

## 2022-11-19 DIAGNOSIS — M25511 Pain in right shoulder: Secondary | ICD-10-CM

## 2022-11-19 NOTE — Progress Notes (Signed)
Office Visit Note   Patient: Ann Fox           Date of Birth: [birth year]           MRN: [id number] Visit Date: 11/19/2022 Requested by: Myrlene Broker, MD [place],  [place] [id number] PCP: Myrlene Broker, MD  Subjective: Chief Complaint  Patient presents with   Right Shoulder - Pain    HPI: Ann Fox is a 82 y.o. female who presents to the office with right shoulder pain.  She did have an injection in November which helped for a day.  Has had pain for well over 2 to 3 months.  Pain wakes her up from sleep at night.  She cannot sleep on the right-hand side.  Hard for her to hold a cup of coffee.  Most pain is in the deltoid region.  Denies any  numbness and tingling.  But does report mild neck pain.  No diabetes and no cardiac history.  Hard for her to drive as well as do most things.  Left shoulder is okay..  She and her husband are very active.  She has had an MRI scan which is reviewed which shows rotator cuff tear of the supraspinatus with moderate to severe arthritis as well as edema and some collapse of the humeral head.              ROS: All systems reviewed are negative as they relate to the chief complaint within the history of present illness.  Patient denies fevers or chills.  Assessment & Plan: Visit Diagnoses:  1. Acute pain of right shoulder     Plan: Impression is end-stage right shoulder arthritis with rotator cuff pathology of the supraspinatus.  Had a long talk with Ann Fox and her husband today about the risk and benefits of reverse shoulder replacement.  Show then the rationale of patient specific instrumentation to obtain optimal implant position and longevity.  The risk and benefits of the procedure were also discussed including not limited to infection or vessel damage instability as well as incomplete pain relief and incomplete restoration of function.  Patient understands risk and benefits as well as the nature of the  rehabilitative process.  Plan at this time is thin cut CT scan preop reverse shoulder replacement.  Continue with range of motion exercises as tolerated to prevent excessive stiffness prior to surgery.  All questions answered.  Follow-Up Instructions: No follow-ups on file.   Orders:  Orders Placed This Encounter  Procedures   CT SHOULDER RIGHT WO CONTRAST   No orders of the defined types were placed in this encounter.     Procedures: No procedures performed   Clinical Data: No additional findings.  Objective: Vital Signs: There were no vitals taken for this visit.  Physical Exam:  Constitutional: Patient appears well-developed HEENT:  Head: Normocephalic Eyes:EOM are normal Neck: Normal range of motion Cardiovascular: Normal rate Pulmonary/chest: Effort normal Neurologic: Patient is alert Skin: Skin is warm Psychiatric: Patient has normal mood and affect  Ortho Exam: Ortho exam demonstrates full active and passive range of motion of the cervical spine.  Range of motion on the right is 40/90/95.  Range of motion on the left is 75/115/180.  Rotator cuff strength is reasonable to subscap strength testing bilaterally 5+ out of 5.  Infraspinatus supraspinatus on the right is 5- out of 5 with no AC joint tenderness on that right-hand side.  Deltoid is functional.  Motor or sensory function to the hand is intact.  Specialty Comments:  MRI LUMBAR SPINE WITHOUT CONTRAST   TECHNIQUE: Multiplanar, multisequence MR imaging of the lumbar spine was performed. No intravenous contrast was administered.   COMPARISON:  Radiography 07/21/2020   FINDINGS: Segmentation:  5 lumbar type vertebral bodies.   Alignment: Mild curvature convex to the right with the apex at L2-3. 1 mm degenerative anterolisthesis L3-4.   Vertebrae:  No fracture or primary bone lesion.   Conus medullaris and cauda equina: Conus extends to the L1-2 level. Conus and cauda equina appear normal.   Paraspinal  and other soft tissues: Negative   Disc levels:   No abnormality at L2-3 or above.   L3-4: Mild bulging of the disc. Mild facet and ligamentous hypertrophy. Mild narrowing of the lateral recesses without visible neural compression.   L4-5: Broad-based disc herniation with caudal migration. Extruded fragment on the right measuring 7 mm. Facet and ligamentous hypertrophy at this level. Multifactorial spinal stenosis at the disc level that could cause neural compression on either or both sides. Particular potential for compression of the right L5 nerve because of the right-sided caudally migrated fragment.   L5-S1: Chronic disc degeneration with loss of disc height. Minimal bulging of the disc. No canal or foraminal stenosis.   IMPRESSION: 1. L4-5: Broad-based disc herniation with caudal migration of a free fragment measuring 7 mm to the right of midline. Facet and ligamentous hypertrophy. Multifactorial spinal stenosis that could cause neural compression on either or both sides. Particular potential for compression of the right L5 nerve because of the caudally migrated fragment. 2. L3-4: Disc bulge. Mild facet and ligamentous hypertrophy. Mild narrowing of the lateral recesses without visible neural compression. Findings at this level could contribute to back pain.     Electronically Signed   By: Nelson Chimes M.D.   On: 08/14/2020 05:22  Imaging: No results found.   PMFS History: Patient Active Problem List   Diagnosis Date Noted   Pain in right shoulder 10/03/2022   Pain in left shoulder 01/30/2022   Pain in right hip 09/14/2020   Lumbar herniated disc 08/18/2020   Lumbago with sciatica, right side 07/21/2020   Degenerative disc disease, lumbar 07/21/2020   Calcification of abdominal aorta ([facility]) 07/21/2020   Osteopenia    Past Medical History:  Diagnosis Date   Elevated cholesterol    Hypertension    Osteopenia     Family History  Problem Relation Age of  Onset   Cancer Mother        Bladder cancer   Osteoporosis Mother    Colon cancer Father    Hypertension Father    Cancer Maternal Grandmother    Breast cancer Cousin        Mat. 1st cousins   Cancer Maternal Uncle     Past Surgical History:  Procedure Laterality Date   APPENDECTOMY     BREAST SURGERY     Breast mass   TONSILLECTOMY AND ADENOIDECTOMY     TUBAL LIGATION     WRIST SURGERY     Cyst   Social History   Occupational History   Not on file  Tobacco Use   Smoking status: Former   Smokeless tobacco: Not on file  Substance and Sexual Activity   Alcohol use: No   Drug use: Not on file   Sexual activity: Yes    Birth control/protection: Post-menopausal

## 2022-12-03 ENCOUNTER — Ambulatory Visit
Admission: RE | Admit: 2022-12-03 | Discharge: 2022-12-03 | Disposition: A | Discharge status: Home / Self Care | Payer: PPO | Source: Ambulatory Visit | Attending: Orthopedic Surgery | Admitting: Orthopedic Surgery

## 2022-12-03 DIAGNOSIS — M25511 Pain in right shoulder: Secondary | ICD-10-CM

## 2022-12-13 ENCOUNTER — Telehealth: Payer: Self-pay | Admitting: Orthopedic Surgery

## 2022-12-13 ENCOUNTER — Telehealth: Payer: Self-pay

## 2022-12-13 NOTE — Telephone Encounter (Signed)
No, this patient is not scheduled.  I don't have anything on this patient.  Need surgery order.

## 2022-12-13 NOTE — Progress Notes (Signed)
Is she scheduled for surgery?

## 2022-12-13 NOTE — Telephone Encounter (Signed)
-----   Message from Meredith Pel, MD sent at 12/13/2022 12:23 PM EST ----- Is she scheduled for surgery?

## 2022-12-13 NOTE — Telephone Encounter (Signed)
Can you please verify whether or not patient is scheduled?

## 2022-12-17 NOTE — Telephone Encounter (Signed)
Blue sheet done.  Jackelyn Poling would you mind calling her today.  Thanks

## 2022-12-26 ENCOUNTER — Other Ambulatory Visit: Payer: Self-pay

## 2023-01-09 ENCOUNTER — Telehealth: Payer: Self-pay | Admitting: Orthopedic Surgery

## 2023-01-09 ENCOUNTER — Other Ambulatory Visit: Payer: Self-pay | Admitting: Surgical

## 2023-01-09 MED ORDER — TRAMADOL HCL 50 MG PO TABS: 50.0000 mg | ORAL_TABLET | Freq: Two times a day (BID) | ORAL | 0 refills | Status: DC | PRN

## 2023-01-09 NOTE — Telephone Encounter (Signed)
Sent in tramadol

## 2023-01-09 NOTE — Telephone Encounter (Signed)
Pt called requesting pain medication for shoulder pains. Pt states over the counter meds are not working. Please send [facility] in [place]. Please call pt at  [phone].

## 2023-01-09 NOTE — Telephone Encounter (Signed)
Called and advised pt.

## 2023-01-23 NOTE — Pre-Procedure Instructions (Signed)
Surgical Instructions    Your procedure is scheduled on January 31, 2023.  Report to [facility] Main Entrance "A" at 9:00 A.M., then check in with the Admitting office.  Call this number if you have problems the morning of surgery:  [phone]   If you have any questions prior to your surgery date call [phone]: Open Monday-Friday 8am-4pm If you experience any cold or flu symptoms such as cough, fever, chills, shortness of breath, etc. between now and your scheduled surgery, please notify us at the above number     Remember:  Do not eat after midnight the night before your surgery  You may drink clear liquids until 8:00AM the morning of your surgery.   Clear liquids allowed are: Water, Non-Citrus Juices (without pulp), Carbonated Beverages, Clear Tea, Black Coffee ONLY (NO MILK, CREAM OR POWDERED CREAMER of any kind), and Gatorade    Take these medicines the morning of surgery with A SIP OF WATER:  amLODipine (NORVASC)  esomeprazole (NEXIUM)  rosuvastatin (CRESTOR)   If Needed: meclizine (ANTIVERT)  traMADol (ULTRAM)    As of today, STOP taking any Aspirin (unless otherwise instructed by your surgeon) Aleve, Naproxen, Ibuprofen, Motrin, Advil, Goody's, BC's, all herbal medications, fish oil, meloxicam (MOBIC), and all vitamins.             [facility] is not responsible for any belongings or valuables.    Do NOT Smoke (Tobacco/Vaping)  24 hours prior to your procedure  If you use a CPAP at night, you may bring your mask for your overnight stay.   Contacts, glasses, hearing aids, dentures or partials may not be worn into surgery, please bring cases for these belongings   For patients admitted to the hospital, discharge time will be determined by your treatment team.   Patients discharged the day of surgery will not be allowed to drive home, and someone needs to stay with them for 24 hours.   SURGICAL WAITING ROOM VISITATION Patients having surgery or a procedure may  have no more than 2 support people in the waiting area - these visitors may rotate.   Children under the age of 56 must have an adult with them who is not the patient. If the patient needs to stay at the hospital during part of their recovery, the visitor guidelines for inpatient rooms apply. Pre-op nurse will coordinate an appropriate time for 1 support person to accompany patient in pre-op.  This support person may not rotate.   Please refer to [email or web address] for the visitor guidelines for Inpatients (after your surgery is over and you are in a regular room).   Oral Hygiene is also important to reduce your risk of infection.  Remember - BRUSH YOUR TEETH THE MORNING OF SURGERY WITH YOUR REGULAR TOOTHPASTE  [facility]- Preparing for Total Shoulder Arthroplasty  Before surgery, you can play an important role. Because skin is not sterile, your skin needs to be as free of germs as possible. You can reduce the number of germs on your skin by using the following products.   Benzoyl Peroxide Gel  o Reduces the number of germs present on the skin  o Applied twice a day to shoulder area starting two days before surgery   Chlorhexidine Gluconate (CHG) Soap (instructions listed above on how to wash with CHG Soap)  o An antiseptic cleaner that kills germs and bonds with the skin to continue killing germs even after washing  o Used for showering the night before surgery and  morning of surgery   ==================================================================  Please follow these instructions carefully:  BENZOYL PEROXIDE 5% GEL  Please do not use if you have an allergy to benzoyl peroxide. If your skin becomes reddened/irritated stop using the benzoyl peroxide.  Starting two days before surgery, apply as follows:  1. Apply benzoyl peroxide in the morning and at night. Apply after taking a shower. If you are not taking a shower clean  entire shoulder front, back, and side along with the armpit with a clean wet washcloth.  2. Place a quarter-sized dollop on your SHOULDER and rub in thoroughly, making sure to cover the front, back, and side of your shoulder, along with the armpit.   2 Days prior to Surgery First Dose on _3/26/24_ Morning Second Dose on _3/26/24_ Night  Day Before Surgery First Dose on __3/27/24__ Morning Night before surgery wash (entire body except face and private areas) with CHG Soap THEN Second Dose on __3/27/24___ Night   Morning of Surgery  wash BODY AGAIN with CHG Soap   4. Do NOT apply benzoyl peroxide gel on the day of surgery   [facility]- Preparing For Surgery  Before surgery, you can play an important role. Because skin is not sterile, your skin needs to be as free of germs as possible. You can reduce the number of germs on your skin by washing with CHG (chlorahexidine gluconate) Soap before surgery.  CHG is an antiseptic cleaner which kills germs and bonds with the skin to continue killing germs even after washing.     Please do not use if you have an allergy to CHG or antibacterial soaps. If your skin becomes reddened/irritated stop using the CHG.  Do not shave (including legs and underarms) for at least 48 hours prior to first CHG shower. It is OK to shave your face.  Please follow these instructions carefully.     Shower the NIGHT BEFORE SURGERY and the MORNING OF SURGERY with CHG Soap.   If you chose to wash your hair, wash your hair first as usual with your normal shampoo. After you shampoo, rinse your hair and body thoroughly to remove the shampoo.  Then [employer] and genitals (private parts) with your normal soap and rinse thoroughly to remove soap.  After that Use CHG Soap as you would any other liquid soap. You can apply CHG directly to the skin and wash gently with a scrungie or a clean washcloth.   Apply the CHG Soap to your body ONLY FROM THE NECK DOWN.  Do not use on  open wounds or open sores. Avoid contact with your eyes, ears, mouth and genitals (private parts). Wash Face and genitals (private parts)  with your normal soap.   Wash thoroughly, paying special attention to the area where your surgery will be performed.  Thoroughly rinse your body with warm water from the neck down.  DO NOT shower/wash with your normal soap after using and rinsing off the CHG Soap.  Pat yourself dry with a CLEAN TOWEL.  8. Apply the Benzoyl Peroxide only the night before surgery.  Do Not use it the morning of surgery.  Wear CLEAN PAJAMAS to bed the night before surgery  Place CLEAN SHEETS on your bed the night before your surgery  DO NOT SLEEP WITH PETS.   Day of Surgery: Take a shower with CHG soap. Wear Clean/Comfortable clothing the morning of surgery Do not wear jewelry or makeup. Do not wear lotions, powders, perfumes/cologne or deodorant. Do not shave  48 hours prior to surgery.  Men may shave face and neck. Do not bring valuables to the hospital. Do not wear nail polish, gel polish, artificial nails, or any other type of covering on natural nails (fingers and toes) If you have artificial nails or gel coating that need to be removed by a nail salon, please have this removed prior to surgery. Artificial nails or gel coating may interfere with anesthesia's ability to adequately monitor your vital signs. Remember to brush your teeth WITH YOUR REGULAR TOOTHPASTE.    If you received a COVID test during your pre-op visit, it is requested that you wear a mask when out in public, stay away from anyone that may not be feeling well, and notify your surgeon if you develop symptoms. If you have been in contact with anyone that has tested positive in the last 10 days, please notify your surgeon.    Please read over the following fact sheets that you were given.

## 2023-01-24 ENCOUNTER — Other Ambulatory Visit: Payer: Self-pay

## 2023-01-24 ENCOUNTER — Encounter (HOSPITAL_COMMUNITY): Payer: Self-pay

## 2023-01-24 ENCOUNTER — Encounter (HOSPITAL_COMMUNITY)
Admission: RE | Admit: 2023-01-24 | Discharge: 2023-01-24 | Disposition: A | Discharge status: Home / Self Care | Payer: PPO | Source: Ambulatory Visit | Attending: Orthopedic Surgery | Admitting: Orthopedic Surgery

## 2023-01-24 VITALS — BP 170/78 | HR 70 | Temp 97.9°F | Resp 16 | Ht 62.0 in | Wt 161.0 lb

## 2023-01-24 DIAGNOSIS — Z01818 Encounter for other preprocedural examination: Secondary | ICD-10-CM | POA: Insufficient documentation

## 2023-01-24 HISTORY — DX: Gastro-esophageal reflux disease without esophagitis: K21.9

## 2023-01-24 HISTORY — DX: Sleep apnea, unspecified: G47.30

## 2023-01-24 LAB — URINALYSIS, ROUTINE W REFLEX MICROSCOPIC
Bilirubin Urine: NEGATIVE
Glucose, UA: NEGATIVE mg/dL
Hgb urine dipstick: NEGATIVE
Ketones, ur: NEGATIVE mg/dL
Leukocytes,Ua: NEGATIVE
Nitrite: NEGATIVE
Protein, ur: NEGATIVE mg/dL
Specific Gravity, Urine: 1.015 (ref 1.005–1.030)
pH: 5 (ref 5.0–8.0)

## 2023-01-24 LAB — BASIC METABOLIC PANEL
Anion gap: 8 (ref 5–15)
BUN: 15 mg/dL (ref 8–23)
CO2: 28 mmol/L (ref 22–32)
Calcium: 9.2 mg/dL (ref 8.9–10.3)
Chloride: 102 mmol/L (ref 98–111)
Creatinine, Ser: 0.8 mg/dL (ref 0.44–1.00)
GFR, Estimated: >60 mL/min (ref >60)
Glucose, Bld: 98 mg/dL (ref 70–99)
Potassium: 3.2 mmol/L — ABNORMAL LOW (ref 3.5–5.1)
Sodium: 138 mmol/L (ref 135–145)

## 2023-01-24 LAB — CBC
HCT: 43.6 % (ref 36.0–46.0)
Hemoglobin: 14.7 g/dL (ref 12.0–15.0)
MCH: 28 pg (ref 26.0–34.0)
MCHC: 33.7 g/dL (ref 30.0–36.0)
MCV: 83 fL (ref 80.0–100.0)
Platelets: 188 10*3/uL (ref 150–400)
RBC: 5.25 MIL/uL — ABNORMAL HIGH (ref 3.87–5.11)
RDW: 13.1 % (ref 11.5–15.5)
WBC: 4.5 10*3/uL (ref 4.0–10.5)
nRBC: 0 % (ref 0.0–0.2)

## 2023-01-24 LAB — SURGICAL PCR SCREEN
MRSA, PCR: NEGATIVE
Staphylococcus aureus: NEGATIVE

## 2023-01-24 NOTE — Progress Notes (Signed)
PCP - Janace Litten, MD Cardiologist - Denies  PPM/ICD - Denies  Chest x-ray - Denies EKG - 01/24/2023 Stress Test - Denies ECHO - Denies Cardiac Cath - Denies  Sleep Study - OSA+ CPAP - Settings on 4  DM: Denies  Blood Thinner Instructions: N/A Aspirin Instructions: Patient instructed to call surgeons office for information on when to stop taking aspirin prior to procedure.  ERAS Protcol - Yes PRE-SURGERY Ensure or G2-  Ensure  COVID TEST- N/A   Anesthesia review: No  Patient denies shortness of breath, fever, cough and chest pain at PAT appointment   All instructions explained to the patient, with a verbal understanding of the material. Patient agrees to go over the instructions while at home for a better understanding. The opportunity to ask questions was provided.

## 2023-01-25 ENCOUNTER — Telehealth: Payer: Self-pay | Admitting: Orthopedic Surgery

## 2023-01-25 LAB — URINE CULTURE: Culture: <10000 — AB

## 2023-01-25 NOTE — Telephone Encounter (Signed)
5 days before surgery

## 2023-01-25 NOTE — Telephone Encounter (Signed)
We will do a nerve block before surgery that will help with pain control for about 1-1/2 to 2 days after surgery.  She will also need to take nail polish off prior to procedure

## 2023-01-25 NOTE — Telephone Encounter (Signed)
Patient states she went for her Pre op and she need to know when to stop taking her ASA. Please advise

## 2023-01-25 NOTE — Telephone Encounter (Signed)
I called and advised pt. She stated understanding  

## 2023-01-25 NOTE — Telephone Encounter (Signed)
I called and advised pt. She also wanted to know if she needs to take her nail polish off both hands and feet. Also can she can a pain pump, she stated her neighbor had one after surgery which helped her. Please advise

## 2023-01-28 ENCOUNTER — Telehealth: Payer: Self-pay | Admitting: Orthopedic Surgery

## 2023-01-28 NOTE — Telephone Encounter (Signed)
Pt called requesting a call back from [facility] states she received a call from her insurance company stating they will not cover her shoulder blades and she need advice and what will be her out come. Please call pt at [phone].

## 2023-01-31 ENCOUNTER — Observation Stay (HOSPITAL_COMMUNITY)
Admission: RE | Admit: 2023-01-31 | Discharge: 2023-02-01 | Disposition: A | Discharge status: Home / Self Care | Payer: PPO | Attending: Orthopedic Surgery | Admitting: Orthopedic Surgery

## 2023-01-31 ENCOUNTER — Encounter (HOSPITAL_COMMUNITY)
Admission: RE | Disposition: A | Discharge status: Home / Self Care | Payer: Self-pay | Source: Home / Self Care | Attending: Orthopedic Surgery

## 2023-01-31 ENCOUNTER — Other Ambulatory Visit: Payer: Self-pay

## 2023-01-31 ENCOUNTER — Ambulatory Visit (HOSPITAL_BASED_OUTPATIENT_CLINIC_OR_DEPARTMENT_OTHER): Payer: PPO | Admitting: Registered Nurse

## 2023-01-31 ENCOUNTER — Encounter (HOSPITAL_COMMUNITY): Payer: Self-pay | Admitting: Orthopedic Surgery

## 2023-01-31 ENCOUNTER — Ambulatory Visit (HOSPITAL_COMMUNITY): Payer: PPO | Admitting: Registered Nurse

## 2023-01-31 ENCOUNTER — Observation Stay (HOSPITAL_COMMUNITY): Payer: PPO

## 2023-01-31 DIAGNOSIS — Z7982 Long term (current) use of aspirin: Secondary | ICD-10-CM | POA: Diagnosis not present

## 2023-01-31 DIAGNOSIS — M19011 Primary osteoarthritis, right shoulder: Principal | ICD-10-CM | POA: Insufficient documentation

## 2023-01-31 DIAGNOSIS — M7521 Bicipital tendinitis, right shoulder: Secondary | ICD-10-CM

## 2023-01-31 DIAGNOSIS — I10 Essential (primary) hypertension: Secondary | ICD-10-CM | POA: Diagnosis not present

## 2023-01-31 DIAGNOSIS — M75101 Unspecified rotator cuff tear or rupture of right shoulder, not specified as traumatic: Secondary | ICD-10-CM | POA: Diagnosis not present

## 2023-01-31 DIAGNOSIS — Z96611 Presence of right artificial shoulder joint: Secondary | ICD-10-CM

## 2023-01-31 DIAGNOSIS — Z01818 Encounter for other preprocedural examination: Secondary | ICD-10-CM

## 2023-01-31 HISTORY — PX: REVERSE SHOULDER ARTHROPLASTY: SHX5054

## 2023-01-31 SURGERY — ARTHROPLASTY, SHOULDER, TOTAL, REVERSE
Anesthesia: General | Site: Shoulder | Laterality: Right

## 2023-01-31 MED ORDER — LACTATED RINGERS IV SOLN: INTRAVENOUS | Status: DC

## 2023-01-31 MED ORDER — TRANEXAMIC ACID-NACL 1000-0.7 MG/100ML-% IV SOLN
1000.0000 mg | INTRAVENOUS | Status: AC
  Administered 2023-01-31: 1000 mg via INTRAVENOUS
  Filled 2023-01-31: qty 100

## 2023-01-31 MED ORDER — PHENYLEPHRINE HCL-NACL 20-0.9 MG/250ML-% IV SOLN
INTRAVENOUS | Status: DC | PRN
  Administered 2023-01-31: 30 ug/min via INTRAVENOUS

## 2023-01-31 MED ORDER — MIDAZOLAM HCL 2 MG/2ML IJ SOLN
INTRAMUSCULAR | Status: AC
  Administered 2023-01-31: 1 mg via INTRAVENOUS
  Filled 2023-01-31: qty 2

## 2023-01-31 MED ORDER — ORAL CARE MOUTH RINSE: 15.0000 mL | Freq: Once | OROMUCOSAL | Status: AC

## 2023-01-31 MED ORDER — CHLORHEXIDINE GLUCONATE 0.12 % MT SOLN
15.0000 mL | Freq: Once | OROMUCOSAL | Status: AC
  Administered 2023-01-31: 15 mL via OROMUCOSAL
  Filled 2023-01-31: qty 15

## 2023-01-31 MED ORDER — METOCLOPRAMIDE HCL 5 MG PO TABS: 5.0000 mg | ORAL_TABLET | Freq: Three times a day (TID) | ORAL | Status: DC | PRN

## 2023-01-31 MED ORDER — FENTANYL CITRATE (PF) 100 MCG/2ML IJ SOLN: 25.0000 ug | INTRAMUSCULAR | Status: DC | PRN

## 2023-01-31 MED ORDER — BUPIVACAINE LIPOSOME 1.3 % IJ SUSP
INTRAMUSCULAR | Status: DC | PRN
  Administered 2023-01-31: 10 mL via PERINEURAL

## 2023-01-31 MED ORDER — PROPOFOL 10 MG/ML IV BOLUS
INTRAVENOUS | Status: AC
  Filled 2023-01-31: qty 20

## 2023-01-31 MED ORDER — AMLODIPINE BESYLATE 2.5 MG PO TABS: 2.5000 mg | ORAL_TABLET | Freq: Every day | ORAL | Status: DC

## 2023-01-31 MED ORDER — MELOXICAM 7.5 MG PO TABS
15.0000 mg | ORAL_TABLET | Freq: Every day | ORAL | Status: DC
  Administered 2023-01-31 – 2023-02-01 (×2): 15 mg via ORAL
  Filled 2023-01-31 (×2): qty 2

## 2023-01-31 MED ORDER — ONDANSETRON HCL 4 MG PO TABS: 4.0000 mg | ORAL_TABLET | Freq: Four times a day (QID) | ORAL | Status: DC | PRN

## 2023-01-31 MED ORDER — CEFAZOLIN SODIUM-DEXTROSE 2-4 GM/100ML-% IV SOLN
2.0000 g | Freq: Three times a day (TID) | INTRAVENOUS | Status: DC
  Administered 2023-01-31 – 2023-02-01 (×2): 2 g via INTRAVENOUS
  Filled 2023-01-31 (×2): qty 100

## 2023-01-31 MED ORDER — BUPIVACAINE HCL (PF) 0.25 % IJ SOLN
INTRAMUSCULAR | Status: DC | PRN
  Administered 2023-01-31: 25 mL via PERINEURAL

## 2023-01-31 MED ORDER — GLYCOPYRROLATE PF 0.2 MG/ML IJ SOSY
PREFILLED_SYRINGE | INTRAMUSCULAR | Status: AC
  Filled 2023-01-31: qty 1

## 2023-01-31 MED ORDER — ROCURONIUM BROMIDE 10 MG/ML (PF) SYRINGE
PREFILLED_SYRINGE | INTRAVENOUS | Status: DC | PRN
  Administered 2023-01-31: 60 mg via INTRAVENOUS

## 2023-01-31 MED ORDER — FENTANYL CITRATE (PF) 250 MCG/5ML IJ SOLN
INTRAMUSCULAR | Status: AC
  Filled 2023-01-31: qty 5

## 2023-01-31 MED ORDER — DOCUSATE SODIUM 100 MG PO CAPS
100.0000 mg | ORAL_CAPSULE | Freq: Two times a day (BID) | ORAL | Status: DC
  Administered 2023-01-31 – 2023-02-01 (×2): 100 mg via ORAL
  Filled 2023-01-31 (×2): qty 1

## 2023-01-31 MED ORDER — NEOSTIGMINE METHYLSULFATE 3 MG/3ML IV SOSY
PREFILLED_SYRINGE | INTRAVENOUS | Status: AC
  Filled 2023-01-31: qty 3

## 2023-01-31 MED ORDER — GLYCOPYRROLATE PF 0.2 MG/ML IJ SOSY
PREFILLED_SYRINGE | INTRAMUSCULAR | Status: AC
  Filled 2023-01-31: qty 2

## 2023-01-31 MED ORDER — METHOCARBAMOL 500 MG PO TABS: 500.0000 mg | ORAL_TABLET | Freq: Four times a day (QID) | ORAL | Status: DC | PRN

## 2023-01-31 MED ORDER — AMISULPRIDE (ANTIEMETIC) 5 MG/2ML IV SOLN: 10.0000 mg | Freq: Once | INTRAVENOUS | Status: DC | PRN

## 2023-01-31 MED ORDER — FENTANYL CITRATE (PF) 100 MCG/2ML IJ SOLN: 50.0000 ug | Freq: Once | INTRAMUSCULAR | Status: AC

## 2023-01-31 MED ORDER — CEFAZOLIN SODIUM-DEXTROSE 2-4 GM/100ML-% IV SOLN
2.0000 g | INTRAVENOUS | Status: AC
  Administered 2023-01-31: 2 g via INTRAVENOUS
  Filled 2023-01-31: qty 100

## 2023-01-31 MED ORDER — METOCLOPRAMIDE HCL 5 MG/ML IJ SOLN: 5.0000 mg | Freq: Three times a day (TID) | INTRAMUSCULAR | Status: DC | PRN

## 2023-01-31 MED ORDER — PANTOPRAZOLE SODIUM 40 MG PO PACK: 40.0000 mg | PACK | Freq: Every day | ORAL | Status: DC

## 2023-01-31 MED ORDER — ONDANSETRON HCL 4 MG/2ML IJ SOLN
INTRAMUSCULAR | Status: AC
  Filled 2023-01-31: qty 2

## 2023-01-31 MED ORDER — FENTANYL CITRATE (PF) 100 MCG/2ML IJ SOLN
INTRAMUSCULAR | Status: AC
  Administered 2023-01-31: 50 ug via INTRAVENOUS
  Filled 2023-01-31: qty 2

## 2023-01-31 MED ORDER — VANCOMYCIN HCL 1000 MG IV SOLR
INTRAVENOUS | Status: DC | PRN
  Administered 2023-01-31: 1000 mg via TOPICAL

## 2023-01-31 MED ORDER — GLYCOPYRROLATE PF 0.2 MG/ML IJ SOSY
PREFILLED_SYRINGE | INTRAMUSCULAR | Status: DC | PRN
  Administered 2023-01-31: .1 mg via INTRAVENOUS
  Administered 2023-01-31: .4 mg via INTRAVENOUS

## 2023-01-31 MED ORDER — PROMETHAZINE HCL 25 MG/ML IJ SOLN: 6.2500 mg | INTRAMUSCULAR | Status: DC | PRN

## 2023-01-31 MED ORDER — MIDAZOLAM HCL 2 MG/2ML IJ SOLN: 1.0000 mg | Freq: Once | INTRAMUSCULAR | Status: AC

## 2023-01-31 MED ORDER — ROCURONIUM BROMIDE 10 MG/ML (PF) SYRINGE
PREFILLED_SYRINGE | INTRAVENOUS | Status: AC
  Filled 2023-01-31: qty 10

## 2023-01-31 MED ORDER — ACETAMINOPHEN 325 MG PO TABS: 325.0000 mg | ORAL_TABLET | Freq: Four times a day (QID) | ORAL | Status: DC | PRN

## 2023-01-31 MED ORDER — ONDANSETRON HCL 4 MG/2ML IJ SOLN
INTRAMUSCULAR | Status: DC | PRN
  Administered 2023-01-31: 4 mg via INTRAVENOUS

## 2023-01-31 MED ORDER — EPHEDRINE 5 MG/ML INJ
INTRAVENOUS | Status: AC
  Filled 2023-01-31: qty 5

## 2023-01-31 MED ORDER — HYDROMORPHONE HCL 1 MG/ML IJ SOLN: 0.5000 mg | INTRAMUSCULAR | Status: DC | PRN

## 2023-01-31 MED ORDER — PROPOFOL 10 MG/ML IV BOLUS
INTRAVENOUS | Status: DC | PRN
  Administered 2023-01-31: 120 mg via INTRAVENOUS

## 2023-01-31 MED ORDER — FENTANYL CITRATE (PF) 250 MCG/5ML IJ SOLN
INTRAMUSCULAR | Status: DC | PRN
  Administered 2023-01-31: 50 ug via INTRAVENOUS
  Administered 2023-01-31 (×2): 25 ug via INTRAVENOUS

## 2023-01-31 MED ORDER — OXYCODONE HCL 5 MG PO TABS: 5.0000 mg | ORAL_TABLET | ORAL | Status: DC | PRN

## 2023-01-31 MED ORDER — HYDROCHLOROTHIAZIDE 12.5 MG PO TABS
12.5000 mg | ORAL_TABLET | Freq: Every day | ORAL | Status: DC
  Administered 2023-02-01: 12.5 mg via ORAL
  Filled 2023-01-31 (×3): qty 1

## 2023-01-31 MED ORDER — IRRISEPT - 450ML BOTTLE WITH 0.05% CHG IN STERILE WATER, USP 99.95% OPTIME
TOPICAL | Status: DC | PRN
  Administered 2023-01-31: 450 mL

## 2023-01-31 MED ORDER — DEXAMETHASONE SODIUM PHOSPHATE 10 MG/ML IJ SOLN
INTRAMUSCULAR | Status: DC | PRN
  Administered 2023-01-31: 10 mg via INTRAVENOUS

## 2023-01-31 MED ORDER — ACETAMINOPHEN 500 MG PO TABS
1000.0000 mg | ORAL_TABLET | Freq: Four times a day (QID) | ORAL | Status: DC
  Administered 2023-01-31 – 2023-02-01 (×2): 1000 mg via ORAL
  Filled 2023-01-31 (×2): qty 2

## 2023-01-31 MED ORDER — PHENOL 1.4 % MT LIQD: 1.0000 | OROMUCOSAL | Status: DC | PRN

## 2023-01-31 MED ORDER — MENTHOL 3 MG MT LOZG
1.0000 | LOZENGE | OROMUCOSAL | Status: DC | PRN
  Filled 2023-01-31: qty 9

## 2023-01-31 MED ORDER — NEOSTIGMINE METHYLSULFATE 10 MG/10ML IV SOLN
INTRAVENOUS | Status: DC | PRN
  Administered 2023-01-31: 3 mg via INTRAVENOUS

## 2023-01-31 MED ORDER — VANCOMYCIN HCL 1000 MG IV SOLR
INTRAVENOUS | Status: AC
  Filled 2023-01-31: qty 20

## 2023-01-31 MED ORDER — POVIDONE-IODINE 10 % EX SWAB
2.0000 | Freq: Once | CUTANEOUS | Status: AC
  Administered 2023-01-31: 2 via TOPICAL

## 2023-01-31 MED ORDER — DEXAMETHASONE SODIUM PHOSPHATE 10 MG/ML IJ SOLN
INTRAMUSCULAR | Status: AC
  Filled 2023-01-31: qty 1

## 2023-01-31 MED ORDER — EPHEDRINE SULFATE-NACL 50-0.9 MG/10ML-% IV SOSY
PREFILLED_SYRINGE | INTRAVENOUS | Status: DC | PRN
  Administered 2023-01-31 (×2): 5 mg via INTRAVENOUS

## 2023-01-31 MED ORDER — METHOCARBAMOL 1000 MG/10ML IJ SOLN: 500.0000 mg | Freq: Four times a day (QID) | INTRAVENOUS | Status: DC | PRN

## 2023-01-31 MED ORDER — LIDOCAINE 2% (20 MG/ML) 5 ML SYRINGE
INTRAMUSCULAR | Status: AC
  Filled 2023-01-31: qty 5

## 2023-01-31 MED ORDER — SODIUM CHLORIDE 0.9 % IV SOLN: INTRAVENOUS | Status: DC

## 2023-01-31 MED ORDER — ACETAMINOPHEN 500 MG PO TABS
1000.0000 mg | ORAL_TABLET | Freq: Once | ORAL | Status: AC
  Administered 2023-01-31: 1000 mg via ORAL
  Filled 2023-01-31: qty 2

## 2023-01-31 MED ORDER — POVIDONE-IODINE 7.5 % EX SOLN
Freq: Once | CUTANEOUS | Status: DC
  Filled 2023-01-31: qty 118

## 2023-01-31 MED ORDER — ASPIRIN 81 MG PO TBEC
81.0000 mg | DELAYED_RELEASE_TABLET | Freq: Every day | ORAL | Status: DC
  Administered 2023-01-31 – 2023-02-01 (×2): 81 mg via ORAL
  Filled 2023-01-31 (×2): qty 1

## 2023-01-31 MED ORDER — ONDANSETRON HCL 4 MG/2ML IJ SOLN: 4.0000 mg | Freq: Four times a day (QID) | INTRAMUSCULAR | Status: DC | PRN

## 2023-01-31 MED ORDER — PANTOPRAZOLE SODIUM 40 MG PO TBEC
40.0000 mg | DELAYED_RELEASE_TABLET | Freq: Every day | ORAL | Status: DC
  Administered 2023-01-31 – 2023-02-01 (×2): 40 mg via ORAL
  Filled 2023-01-31 (×3): qty 1

## 2023-01-31 MED ORDER — 0.9 % SODIUM CHLORIDE (POUR BTL) OPTIME
TOPICAL | Status: DC | PRN
  Administered 2023-01-31: 1000 mL

## 2023-01-31 SURGICAL SUPPLY — 88 items
AID PSTN UNV HD RSTRNT DISP (MISCELLANEOUS) ×1
ALCOHOL 70% 16 OZ (MISCELLANEOUS) ×1 IMPLANT
APL PRP STRL LF DISP 70% ISPRP (MISCELLANEOUS) ×1
BAG COUNTER SPONGE SURGICOUNT (BAG) ×1 IMPLANT
BAG SPNG CNTER NS LX DISP (BAG) ×1
BASEPLATE AUG MED W-TAPER (Plate) IMPLANT
BIT DRILL 2.7 W/STOP DISP (BIT) IMPLANT
BIT DRILL TWIST 2.7 (BIT) IMPLANT
BLADE SAW SGTL 13X75X1.27 (BLADE) ×1 IMPLANT
BRNG HUM +3 36 RVRS SHLDR (Shoulder) ×1 IMPLANT
BSPLAT GLND MED AUG TPR ADPR (Plate) ×1 IMPLANT
CHLORAPREP W/TINT 26 (MISCELLANEOUS) ×1 IMPLANT
COOLER ICEMAN CLASSIC (MISCELLANEOUS) ×1 IMPLANT
COVER SURGICAL LIGHT HANDLE (MISCELLANEOUS) ×1 IMPLANT
DRAPE INCISE IOBAN 66X45 STRL (DRAPES) ×1 IMPLANT
DRAPE U-SHAPE 47X51 STRL (DRAPES) ×2 IMPLANT
DRSG AQUACEL AG ADV 3.5X10 (GAUZE/BANDAGES/DRESSINGS) ×1 IMPLANT
ELECT BLADE 4.0 EZ CLEAN MEGAD (MISCELLANEOUS) ×1
ELECT REM PT RETURN 9FT ADLT (ELECTROSURGICAL) ×1
ELECTRODE BLDE 4.0 EZ CLN MEGD (MISCELLANEOUS) ×1 IMPLANT
ELECTRODE REM PT RTRN 9FT ADLT (ELECTROSURGICAL) ×1 IMPLANT
GAUZE SPONGE 4X4 12PLY STRL LF (GAUZE/BANDAGES/DRESSINGS) ×1 IMPLANT
GLENOID SPHERE 36+6 (Joint) IMPLANT
GLOVE BIOGEL PI IND STRL 7.0 (GLOVE) ×1 IMPLANT
GLOVE BIOGEL PI IND STRL 8 (GLOVE) ×1 IMPLANT
GLOVE ECLIPSE 7.0 STRL STRAW (GLOVE) ×1 IMPLANT
GLOVE ECLIPSE 8.0 STRL XLNG CF (GLOVE) ×1 IMPLANT
GOWN STRL REUS W/ TWL LRG LVL3 (GOWN DISPOSABLE) ×1 IMPLANT
GOWN STRL REUS W/ TWL XL LVL3 (GOWN DISPOSABLE) ×1 IMPLANT
GOWN STRL REUS W/TWL LRG LVL3 (GOWN DISPOSABLE) ×1
GOWN STRL REUS W/TWL XL LVL3 (GOWN DISPOSABLE) ×1
GUIDE MODEL REV SHLD RT (ORTHOPEDIC DISPOSABLE SUPPLIES) IMPLANT
HYDROGEN PEROXIDE 16OZ (MISCELLANEOUS) ×1 IMPLANT
JET LAVAGE IRRISEPT WOUND (IRRIGATION / IRRIGATOR) ×1
KIT BASIN OR (CUSTOM PROCEDURE TRAY) ×1 IMPLANT
KIT TURNOVER KIT B (KITS) ×1 IMPLANT
LAVAGE JET IRRISEPT WOUND (IRRIGATION / IRRIGATOR) ×1 IMPLANT
LOOP VASCLR MAXI BLUE 18IN ST (MISCELLANEOUS) ×1 IMPLANT
LOOP VASCULAR MAXI 18 BLUE (MISCELLANEOUS)
LOOPS VASCLR MAXI BLUE 18IN ST (MISCELLANEOUS) IMPLANT
MANIFOLD NEPTUNE II (INSTRUMENTS) ×1 IMPLANT
NDL SUT 6 .5 CRC .975X.05 MAYO (NEEDLE) IMPLANT
NDL TAPERED W/ NITINOL LOOP (MISCELLANEOUS) ×1 IMPLANT
NEEDLE MAYO TAPER (NEEDLE) ×1
NEEDLE TAPERED W/ NITINOL LOOP (MISCELLANEOUS) IMPLANT
NS IRRIG 1000ML POUR BTL (IV SOLUTION) ×1 IMPLANT
PACK SHOULDER (CUSTOM PROCEDURE TRAY) ×1 IMPLANT
PAD ARMBOARD 7.5X6 YLW CONV (MISCELLANEOUS) ×2 IMPLANT
PAD COLD SHLDR WRAP-ON (PAD) ×1 IMPLANT
PASSER SUT SWANSON 36MM LOOP (INSTRUMENTS) ×1 IMPLANT
PIN HUMERAL STMN 3.2MMX9IN (INSTRUMENTS) IMPLANT
PIN STEINMANN THREADED TIP (PIN) IMPLANT
PIN THREADED REVERSE (PIN) IMPLANT
REAMER GUIDE BUSHING SURG DISP (MISCELLANEOUS) IMPLANT
REAMER GUIDE W/SCREW AUG (MISCELLANEOUS) IMPLANT
RESTRAINT HEAD UNIVERSAL NS (MISCELLANEOUS) ×1 IMPLANT
RETRIEVER SUT HEWSON (MISCELLANEOUS) IMPLANT
SCREW BONE STRL 6.5MMX30MM (Screw) IMPLANT
SCREW LOCKING 4.75MMX15MM (Screw) IMPLANT
SCREW LOCKING NS 4.75MMX20MM (Screw) IMPLANT
SCREW LOCKING STRL 4.75X25X3.5 (Screw) IMPLANT
SLING ARM IMMOBILIZER LRG (SOFTGOODS) ×1 IMPLANT
SOL PREP POV-IOD 4OZ 10% (MISCELLANEOUS) ×1 IMPLANT
SPONGE T-LAP 18X18 ~~LOC~~+RFID (SPONGE) ×1 IMPLANT
STEM HUMERAL STRL 13MMX83MM (Stem) IMPLANT
STRIP CLOSURE SKIN 1/2X4 (GAUZE/BANDAGES/DRESSINGS) ×1 IMPLANT
SUCTION FRAZIER HANDLE 10FR (MISCELLANEOUS) ×1
SUCTION TUBE FRAZIER 10FR DISP (MISCELLANEOUS) ×1 IMPLANT
SUT BROADBAND TAPE 2PK 1.5 (SUTURE) IMPLANT
SUT FIBERWIRE #2 38 T-5 BLUE (SUTURE)
SUT MAXBRAID (SUTURE) IMPLANT
SUT MNCRL AB 3-0 PS2 18 (SUTURE) ×1 IMPLANT
SUT SILK 2 0 TIES 10X30 (SUTURE) ×1 IMPLANT
SUT VIC AB 0 CT1 27 (SUTURE) ×4
SUT VIC AB 0 CT1 27XBRD ANBCTR (SUTURE) ×4 IMPLANT
SUT VIC AB 1 CT1 27 (SUTURE) ×3
SUT VIC AB 1 CT1 27XBRD ANBCTR (SUTURE) ×2 IMPLANT
SUT VIC AB 2-0 CT1 27 (SUTURE) ×4
SUT VIC AB 2-0 CT1 TAPERPNT 27 (SUTURE) ×3 IMPLANT
SUT VICRYL 0 UR6 27IN ABS (SUTURE) ×2 IMPLANT
SUTURE FIBERWR #2 38 T-5 BLUE (SUTURE) IMPLANT
TOWEL GREEN STERILE (TOWEL DISPOSABLE) ×1 IMPLANT
TRAY FOL W/BAG SLVR 16FR STRL (SET/KITS/TRAYS/PACK) IMPLANT
TRAY FOLEY W/BAG SLVR 16FR LF (SET/KITS/TRAYS/PACK)
TRAY HUM REV SHOULDER 36 +3 (Shoulder) IMPLANT
TRAY HUM REV SHOULDER STD +6 (Shoulder) IMPLANT
VASCULAR TIE MAXI BLUE 18IN ST (MISCELLANEOUS)
WATER STERILE IRR 1000ML POUR (IV SOLUTION) ×1 IMPLANT

## 2023-01-31 NOTE — Anesthesia Procedure Notes (Signed)
Anesthesia Regional Block: Interscalene brachial plexus block   Pre-Anesthetic Checklist: , timeout performed,  Correct Patient, Correct Site, Correct Laterality,  Correct Procedure, Correct Position, site marked,  Risks and benefits discussed,  Surgical consent,  Pre-op evaluation,  At surgeon's request and post-op pain management  Laterality: Right  Prep: chloraprep       Needles:  Injection technique: Single-shot  Needle Type: Echogenic Stimulator Needle     Needle Length: 5cm  Needle Gauge: 22     Additional Needles:   Narrative:  Start time: 01/31/2023 10:18 AM End time: 01/31/2023 10:28 AM Injection made incrementally with aspirations every 5 mL.  Performed by: Personally  Anesthesiologist: Duane Boston, MD  Additional Notes: Functioning IV was confirmed and monitors applied.  A 67mm 22ga echogenic arrow stimulator was used. Sterile prep and drape,hand hygiene and sterile gloves were used.Ultrasound guidance: relevant anatomy identified, needle position confirmed, local anesthetic spread visualized around nerve(s)., vascular puncture avoided.  Image printed for medical record.  Negative aspiration and negative test dose prior to incremental administration of local anesthetic. The patient tolerated the procedure well.

## 2023-01-31 NOTE — Anesthesia Preprocedure Evaluation (Addendum)
Anesthesia Evaluation  Patient identified by MRN, date of birth, ID band Patient awake    Reviewed: Allergy & Precautions, NPO status , Patient's Chart, lab work & pertinent test results  History of Anesthesia Complications Negative for: history of anesthetic complications  Airway Mallampati: III  TM Distance: >3 FB Neck ROM: Full    Dental no notable dental hx. (+) Implants, Caps, Dental Advisory Given   Pulmonary sleep apnea and Continuous Positive Airway Pressure Ventilation , former smoker   Pulmonary exam normal        Cardiovascular hypertension, Pt. on medications Normal cardiovascular exam     Neuro/Psych negative neurological ROS     GI/Hepatic Neg liver ROS,GERD  Medicated,,  Endo/Other  negative endocrine ROS    Renal/GU negative Renal ROS     Musculoskeletal negative musculoskeletal ROS (+)    Abdominal   Peds  Hematology negative hematology ROS (+)   Anesthesia Other Findings   Reproductive/Obstetrics                             Anesthesia Physical Anesthesia Plan  ASA: 2  Anesthesia Plan: General   Post-op Pain Management: Regional block* and Tylenol PO (pre-op)*   Induction: Intravenous  PONV Risk Score and Plan: 3 and Ondansetron, Dexamethasone and Midazolam  Airway Management Planned: Oral ETT  Additional Equipment:   Intra-op Plan:   Post-operative Plan: Extubation in OR  Informed Consent: I have reviewed the patients History and Physical, chart, labs and discussed the procedure including the risks, benefits and alternatives for the proposed anesthesia with the patient or authorized representative who has indicated his/her understanding and acceptance.     Dental advisory given  Plan Discussed with: Anesthesiologist and CRNA  Anesthesia Plan Comments:        Anesthesia Quick Evaluation

## 2023-01-31 NOTE — H&P (Signed)
Ann Fox is an 82 y.o. female.   Chief Complaint: Right shoulder pain HPI:  Ann Fox is a 82 y.o. female who presents  with history of right shoulder pain.  She did have an injection in November which helped for a day.  Has had pain for well over 2 to 3 months.  Pain wakes her up from sleep at night.  She cannot sleep on the right-hand side.  Hard for her to hold a cup of coffee.  Most pain is in the deltoid region.  Denies any  numbness and tingling.  But does report mild neck pain.  No diabetes and no cardiac history.  Hard for her to drive as well as do most things.  Left shoulder is okay..  She and her husband are very active.  She has had an MRI scan which is reviewed which shows rotator cuff tear of the supraspinatus with moderate to severe arthritis as well as edema and some collapse of the humeral head.   Past Medical History:  Diagnosis Date   Elevated cholesterol    GERD (gastroesophageal reflux disease)    Hypertension    Osteopenia    Sleep apnea     Past Surgical History:  Procedure Laterality Date   APPENDECTOMY     BREAST SURGERY     Breast mass   LIPOMA EXCISION  1970   TONSILLECTOMY AND ADENOIDECTOMY     TUBAL LIGATION     WRIST SURGERY     Cyst    Family History  Problem Relation Age of Onset   Cancer Mother        Bladder cancer   Osteoporosis Mother    Colon cancer Father    Hypertension Father    Cancer Maternal Grandmother    Breast cancer Cousin        Mat. 1st cousins   Cancer Maternal Uncle    Social History:  reports that she has quit smoking. She does not have any smokeless tobacco history on file. She reports that she does not drink alcohol. No history on file for drug use.  Allergies: No Known Allergies  Medications Prior to Admission  Medication Sig Dispense Refill   amLODipine (NORVASC) 5 MG tablet Take 2.5 mg by mouth daily.     aspirin EC 81 MG tablet Take 81 mg by mouth daily. Swallow whole.     esomeprazole (NEXIUM) 40 MG  capsule Take 40 mg by mouth daily before breakfast.     hydrochlorothiazide (MICROZIDE) 12.5 MG capsule Take 12.5 mg by mouth daily.     meclizine (ANTIVERT) 25 MG tablet Take 25 mg by mouth 3 (three) times daily as needed for dizziness.     meloxicam (MOBIC) 15 MG tablet Take 15 mg by mouth daily.       Multiple Vitamin (MULTIVITAMIN) tablet Take 2 tablets by mouth daily.     rosuvastatin (CRESTOR) 20 MG tablet Take 20 mg by mouth daily.     traMADol (ULTRAM) 50 MG tablet Take 1 tablet (50 mg total) by mouth every 12 (twelve) hours as needed. 30 tablet 0    No results found for this or any previous visit (from the past 48 hour(s)). No results found.  Review of Systems  Musculoskeletal:  Positive for arthralgias.  All other systems reviewed and are negative.   Blood pressure (!) 171/90, pulse 73, temperature 97.9 F (36.6 C), temperature source Oral, resp. rate 17, height 5\' 2"  (1.575 m), weight 72.6 kg, SpO2  96 %. Physical Exam Vitals reviewed.  HENT:     Head: Normocephalic.     Nose: Nose normal.     Mouth/Throat:     Mouth: Mucous membranes are moist.  Eyes:     Pupils: Pupils are equal, round, and reactive to light.  Cardiovascular:     Rate and Rhythm: Normal rate.     Pulses: Normal pulses.  Pulmonary:     Effort: Pulmonary effort is normal.  Abdominal:     General: Abdomen is flat.  Musculoskeletal:     Cervical back: Normal range of motion.  Skin:    General: Skin is warm.     Capillary Refill: Capillary refill takes less than 2 seconds.  Neurological:     General: No focal deficit present.     Mental Status: She is alert.  Psychiatric:        Mood and Affect: Mood normal.     Ortho exam demonstrates full active and passive range of motion of the cervical spine. Range of motion on the right is 40/90/95. Range of motion on the left is 75/115/180. Rotator cuff strength is reasonable to subscap strength testing bilaterally 5+ out of 5. Infraspinatus supraspinatus  on the right is 5- out of 5 with no AC joint tenderness on that right-hand side. Deltoid is functional. Motor or sensory function to the hand is intact.  Assessment/Plan Impression is end-stage right shoulder arthritis with rotator cuff pathology of the supraspinatus. Had a long talk with Sherell and her husband  about the risks and benefits of reverse shoulder replacement. Show then the rationale of patient specific instrumentation to obtain optimal implant position and longevity. The risk and benefits of the procedure were also discussed including not limited to infection or vessel damage instability as well as incomplete pain relief and incomplete restoration of function. Patient understands risk and benefits as well as the nature of the rehabilitative process.  thin cut CT scan has been performed for patient specific instrumentation for reverse shoulder replacement. Continue with range of motion exercises as tolerated to prevent excessive stiffness prior to surgery. All questions answered.   Anderson Malta, MD 01/31/2023, 10:06 AM

## 2023-01-31 NOTE — Transfer of Care (Signed)
Immediate Anesthesia Transfer of Care Note  Patient: Ann Fox  Procedure(s) Performed: RIGHT REVERSE SHOULDER ARTHROPLASTY (Right: Shoulder)  Patient Location: PACU  Anesthesia Type:General and Regional  Level of Consciousness: awake, drowsy, and patient cooperative  Airway & Oxygen Therapy: Patient connected to face mask oxygen  Post-op Assessment: Report given to RN and Post -op Vital signs reviewed and stable  Post vital signs: Reviewed and stable  Last Vitals:  Vitals Value Taken Time  BP    Temp    Pulse 70 01/31/23 1446  Resp 13 01/31/23 1447  SpO2 100 % 01/31/23 1446  Vitals shown include unvalidated device data.  Last Pain:  Vitals:   01/31/23 0908  TempSrc:   PainSc: 3       Patients Stated Pain Goal: 3 ([id number])  Complications: No notable events documented.

## 2023-01-31 NOTE — Anesthesia Postprocedure Evaluation (Signed)
Anesthesia Post Note  Patient: Ann Fox  Procedure(s) Performed: RIGHT REVERSE SHOULDER ARTHROPLASTY (Right: Shoulder)     Patient location during evaluation: PACU Anesthesia Type: General Level of consciousness: awake and alert Pain management: pain level controlled Vital Signs Assessment: post-procedure vital signs reviewed and stable Respiratory status: spontaneous breathing, nonlabored ventilation, respiratory function stable and patient connected to nasal cannula oxygen Cardiovascular status: blood pressure returned to baseline and stable Postop Assessment: no apparent nausea or vomiting Anesthetic complications: no  No notable events documented.  Last Vitals:  Vitals:   01/31/23 1624 01/31/23 2025  BP: (!) 140/74 136/83  Pulse: 68 83  Resp: 17 18  Temp: (!) 36.1 C 36.8 C  SpO2: 98% 92%    Last Pain:  Vitals:   01/31/23 2057  TempSrc:   PainSc: 0-No pain                 Ritchard Paragas

## 2023-01-31 NOTE — Op Note (Signed)
NAME: BALJINDER, RADEN MEDICAL RECORD NO: [id number] ACCOUNT NO: [id number] DATE OF BIRTH: Jan 20, 1941 FACILITY: [facility] LOCATION: [facility] PHYSICIAN: Yetta Barre. Marlou Sa, MD  Operative Report   DATE OF PROCEDURE: 01/31/2023  PREOPERATIVE DIAGNOSES:  Right shoulder rotator cuff tear, biceps tendinitis and severe glenohumeral joint arthritis.  POSTOPERATIVE DIAGNOSES:  Right shoulder rotator cuff tear, biceps tendinitis and severe glenohumeral joint arthritis.  PROCEDURE:  Right shoulder reverse shoulder replacement and biceps tenodesis using Biomet components including glenosphere 36+6 offset with humeral stem size 13, medium augmented baseplate with 1 central compression screw and 4 peripheral locking screws  and comprehensive reverse mini humeral tray +6 taper offset, 40 mm diameter with a +3 thickness highly cross-linked polyethylene bearing.  SURGEON:  Yetta Barre. Marlou Sa, MD  ASSISTANT:  Annie Main, PA.  INDICATIONS:  The patient is an 82 year old with right shoulder pain refractory to nonoperative management, who presents for operative management after explanation of risks and benefits.  DESCRIPTION OF PROCEDURE:  The patient was brought to the operating room where general anesthetic was induced.  Preoperative antibiotics administered.  Timeout was called.  The patient was placed in the beach chair position with head in neutral position.   Right shoulder, arm and hand prescrubbed with hydrogen peroxide followed by alcohol and then Betadine, which was allowed to air dry then ChloraPrep solution.  Ioban used to seal the operative field.  Charlie Pitter was also used to cover the entire operative  field.  After calling timeout, deltopectoral approach was made.  Skin and subcutaneous tissue were sharply divided.  Cephalic vein mobilized medially.  About the upper 1.5 cm of the pectoralis tendon was released.  Biceps tendon then tenodesed to the pec  tendon stump along with surrounding soft tissue using 5-0  Vicryl sutures.  The biceps tendon was then incised and the rotator interval and transverse humeral ligament were opened and up to the base of the coracoid process.  At this time, the anterior  portion of the deltoid was released from the humerus.  This was done manually.  Subdeltoid and subacromial adhesions were released.  The axillary nerve was palpated and protected at all times during the case.  Circumflex vessels were then ligated.   Subscap was then detached using a 15 blade from the lesser tuberosity and that detachment was taken down including the capsule and around the humeral neck including the inferior 2 cm from the humeral neck around to the 5 o'clock position.  This allowed  the humeral head to be subluxated and dislocated.  Reaming was then performed up to size 13.  The humeral head was then cut in 30 degrees of retroversion, which matched the patient's native humeral head version.  Next, the broaching was performed up to  size 13 and a nice broaching was performed.  Size 13 stem was left in place and a 38 mm cap was placed.  Posterior retractor was then placed.  With care being taken to avoid injury to the axillary nerve the labrum was circumferentially excised using  electrocautery.  Bankart lesion created from the 12 o'clock to 6 o'clock position.  This gave good exposure of the glenoid.  Using patient-specific instrumentation, the guide was placed on the glenoid face and reaming was performed to the appropriate  depth.  Superior reaming for the augment was then performed.  A very good contact was achieved.  Baseplate was then placed in a central compression screw, which obtained excellent purchase was placed and four peripheral locking screws were then placed.  Then, we did trial reductions, most stable construct was +6 glenosphere 36 mm along with +6 offset humeral tray and +3 polyethylene liner.  Trial components were removed.  Suture tapes placed through the lesser tuberosity for  reattachment of the subscap.   IrriSept solution was utilized after the incision and at multiple times during the case.  This was then placed into the humeral canal.  This was then removed and the glenosphere was tapped into the [facility] taper with excellent fit obtained.  Then, we  placed the humeral stem with good purchase obtained as well.  This was done after we placed vancomycin powder in the humeral canal. Re-reduction was performed with multiple combinations and the most stable combination, which was "two fingers tight" as  well as very stable with extension, adduction and forward force was the +6 offset humeral tray with +3 liner.  This true liner was placed and excellent stability parameters were maintained.  Next, thorough irrigation was performed.  Axillary nerve again  palpated and found to be intact.  The subscap was then reattached using the suture tapes using Nice knots with the arm in about 25-30 degrees of external rotation.  IrriSept solution then used to irrigate the joint and vancomycin powder placed on the  prosthesis and then the rotator interval was closed with the arm in 25 degrees of external rotation using #1 Vicryl suture.  Further irrigation was utilized and then vancomycin powder placed on the subscap and then below the deltopectoral interval, which  was then closed using #1 Vicryl suture followed by interrupted inverted 0 Vicryl suture, 2-0 Vicryl suture, and 3-0 Monocryl with Steri-Strips and Aquacel dressing applied.  Shoulder sling was applied.  Luke's assistance was required at all times for  retraction, opening, closing, mobilization of tissue.  His assistance was a medical necessity.   PUS D: 01/31/2023 3:03:03 pm T: 01/31/2023 7:06:00 pm  JOB: [id number]

## 2023-01-31 NOTE — Plan of Care (Signed)
  Problem: Education: Goal: Knowledge of the prescribed therapeutic regimen will improve Outcome: Progressing   Problem: Activity: Goal: Ability to tolerate increased activity will improve Outcome: Progressing   Problem: Pain Management: Goal: Pain level will decrease with appropriate interventions Outcome: Progressing   

## 2023-01-31 NOTE — Anesthesia Procedure Notes (Signed)
Procedure Name: Intubation Date/Time: 01/31/2023 11:09 AM  Performed by: Ester Rink, CRNAPre-anesthesia Checklist: Patient identified, Emergency Drugs available, Suction available and Patient being monitored Patient Re-evaluated:Patient Re-evaluated prior to induction Oxygen Delivery Method: Circle system utilized Preoxygenation: Pre-oxygenation with 100% oxygen Induction Type: IV induction Ventilation: Mask ventilation without difficulty Laryngoscope Size: Mac and 4 Grade View: Grade I Tube type: Oral Tube size: 7.0 mm Number of attempts: 1 Airway Equipment and Method: Stylet and Oral airway Placement Confirmation: ETT inserted through vocal cords under direct vision, positive ETCO2 and breath sounds checked- equal and bilateral Secured at: 22 cm Tube secured with: Tape Dental Injury: Teeth and Oropharynx as per pre-operative assessment

## 2023-01-31 NOTE — Brief Op Note (Signed)
   01/31/2023  2:54 PM  PATIENT:  Arnold Long  82 y.o. female  PRE-OPERATIVE DIAGNOSIS:  RIGHT SHOULDER ROTATOR CUFF ARTHROPATHY, ARTHRITIS,biceps tendonitis  POST-OPERATIVE DIAGNOSIS:  RIGHT SHOULDER ROTATOR CUFF ARTHROPATHY, ARTHRITIS,biceps tendonitis  PROCEDURE:  Procedure(s): RIGHT REVERSE SHOULDER ARTHROPLASTY,biceps tenodesis  SURGEON:  Surgeon(s): Meredith Pel, MD  ASSISTANT: magnant pa  ANESTHESIA:   general  EBL: 75 ml    Total I/O In: 1500 [I.V.:1300; IV Piggyback:200] Out: 150 [Blood:150]  BLOOD ADMINISTERED: none  DRAINS:    LOCAL MEDICATIONS USED:  none  SPECIMEN:  No Specimen  COUNTS:  YES  TOURNIQUET:  * No tourniquets in log *  DICTATION: .Other Dictation: Dictation Number done  PLAN OF CARE: Admit for overnight observation  PATIENT DISPOSITION:  PACU - hemodynamically stable

## 2023-01-31 NOTE — Telephone Encounter (Signed)
Ok for black brace thx

## 2023-02-01 DIAGNOSIS — M19011 Primary osteoarthritis, right shoulder: Secondary | ICD-10-CM | POA: Diagnosis not present

## 2023-02-01 MED ORDER — OXYCODONE HCL 5 MG PO TABS: 5.0000 mg | ORAL_TABLET | ORAL | 0 refills | Status: AC | PRN

## 2023-02-01 MED ORDER — TRAZODONE HCL 50 MG PO TABS
50.0000 mg | ORAL_TABLET | Freq: Once | ORAL | Status: DC
  Filled 2023-02-01: qty 1

## 2023-02-01 MED ORDER — METHOCARBAMOL 500 MG PO TABS: 500.0000 mg | ORAL_TABLET | Freq: Three times a day (TID) | ORAL | 0 refills | Status: AC | PRN

## 2023-02-01 MED ORDER — DOCUSATE SODIUM 100 MG PO CAPS: 100.0000 mg | ORAL_CAPSULE | Freq: Two times a day (BID) | ORAL | 0 refills | Status: AC

## 2023-02-01 NOTE — Progress Notes (Signed)
Discharge instructions given to the patient and her spouse.  Both verbalized understanding.  Right sling in place, denies pain at this time.  Encouraged to call the doctor for concerns.  Discharged home.

## 2023-02-01 NOTE — Evaluation (Signed)
Occupational Therapy Evaluation Patient Details Name: Ann Fox MRN: [id number] DOB: [birth year] Today's Date: 02/01/2023   History of Present Illness Pt is an 82 y.o. female s/p R reverse shoulder arthroplasty and biceps tenodesis. PMH significant for GERD, HTN, osteopenia, sleep apnea.   Clinical Impression   PTA, pt lived with her husband and was very active and independent. Upon eval, pt performing UB ADL with min A, sling application with mod A, and LB Adl/functional mobility with mod I. Nerve block active at level of elbow, thus, limiting ability to flex elbow; likely mod I for UB ADL once block wears off. Educated and demonstrating use of compensatory technique for bathing, dressing, grooming, sling application, sleep positioning, and use of ice machine. Educated regarding precautions, weightbearing status, elbow/wrist/hand exercises, pendulums as well. Follow physicians orders for discharge therapies to optimize functional use of RUE and progression of mobility.      Recommendations for follow up therapy are one component of a multi-disciplinary discharge planning process, led by the attending physician.  Recommendations may be updated based on patient status, additional functional criteria and insurance authorization.   Assistance Recommended at Discharge PRN  Patient can return home with the following A little help with bathing/dressing/bathroom;Assistance with cooking/housework;Assistance with feeding;Help with stairs or ramp for entrance;Assist for transportation    Functional Status Assessment  Patient has had a recent decline in their functional status and demonstrates the ability to make significant improvements in function in a reasonable and predictable amount of time.  Equipment Recommendations  None recommended by OT    Recommendations for Other Services       Precautions / Restrictions Precautions Precautions: Shoulder Type of Shoulder Precautions: Per orders,  NWB, ok for elbow/wrist/hand ROM, no AROM of shoulder, external rotation 0-30. Sling at all times except for ADL and exercise.      Mobility Bed Mobility Overal bed mobility: Needs Assistance Bed Mobility: Rolling, Sidelying to Sit Rolling: Supervision Sidelying to sit: Supervision       General bed mobility comments: for technique    Transfers Overall transfer level: Modified independent Equipment used: None               General transfer comment: no assist needed; good maintenance of NWB precautions through RUE      Balance Overall balance assessment: Modified Independent                                         ADL either performed or assessed with clinical judgement   ADL Overall ADL's : Needs assistance/impaired Eating/Feeding: Set up;Sitting Eating/Feeding Details (indicate cue type and reason): set-up for opening containers, etc Grooming: Oral care;Modified independent;Standing Grooming Details (indicate cue type and reason): at sink Upper Body Bathing: Modified independent;Standing   Lower Body Bathing: Modified independent;Sit to/from stand   Upper Body Dressing : Minimal assistance;Sitting Upper Body Dressing Details (indicate cue type and reason): Min A for donning shirt; likley mod I once able to flex at elbow. Mod A for donning sling during new learning. Husband or sister able to assist at home. Lower Body Dressing: Modified independent;Sit to/from stand Lower Body Dressing Details (indicate cue type and reason): able to doff socks, donn pants, and don slippers without assistance. Toilet Transfer: Modified Independent;Ambulation;Comfort height toilet   Toileting- Clothing Manipulation and Hygiene: Modified independent   Tub/ Shower Transfer: Walk-in shower;Modified independent;Ambulation;Grab bars;Shower seat   Functional  mobility during ADLs: Modified independent       Vision Baseline Vision/History: 1 Wears glasses Ability to  See in Adequate Light: 0 Adequate Patient Visual Report: No change from baseline Vision Assessment?: No apparent visual deficits Additional Comments: reading along with handouts during session     Perception Perception Perception Tested?: No   Praxis Praxis Praxis tested?: Within functional limits    Pertinent Vitals/Pain Pain Assessment Pain Assessment: No/denies pain     Hand Dominance Right   Extremity/Trunk Assessment Upper Extremity Assessment Upper Extremity Assessment: RUE deficits/detail RUE Deficits / Details: Decr sensation above elbow as nerve block still active; unable to flex elbow due to nerve block; PROM WFL. AROM wrist/hand WFL. Still experiencing some numbness at the first digit. RUE Sensation: decreased light touch RUE Coordination: decreased gross motor   Lower Extremity Assessment Lower Extremity Assessment: Overall WFL for tasks assessed       Communication Communication Communication: No difficulties   Cognition Arousal/Alertness: Awake/alert Behavior During Therapy: WFL for tasks assessed/performed Overall Cognitive Status: Within Functional Limits for tasks assessed                                       General Comments  VSS. Pt very pleasant and conversational    Exercises Exercises: Shoulder Shoulder Exercises Pendulum Exercise: PROM, Right, 10 reps, Seated Elbow Flexion: PROM, Right, 10 reps, Seated Elbow Extension: PROM, Right, 10 reps, Seated Wrist Flexion: AROM, Right, 10 reps, Seated Wrist Extension: AROM, Right, 10 reps, Seated Digit Composite Flexion: AROM, Right, 10 reps, Seated Composite Extension: AROM, Right, 10 reps, Seated Neck Flexion: AAROM, 5 reps Neck Extension: AROM, 5 reps Neck Lateral Flexion - Right: AROM, 5 reps Neck Lateral Flexion - Left: AROM, 5 reps   Shoulder Instructions Shoulder Instructions Donning/doffing shirt without moving shoulder: Minimal assistance;Patient able to independently  direct caregiver Method for sponge bathing under operated UE: Modified independent Donning/doffing sling/immobilizer: Moderate assistance Correct positioning of sling/immobilizer: Modified independent Pendulum exercises (written home exercise program): Supervision/safety (for technique initially) ROM for elbow, wrist and digits of operated UE: Modified independent (Using LUE to provide PROM elbow flexion this session) Sling wearing schedule (on at all times/off for ADL's): Modified independent Proper positioning of operated UE when showering: Modified independent Positioning of UE while sleeping: Modified independent    Home Living Family/patient expects to be discharged to:: Private residence Living Arrangements: Spouse/significant other Available Help at Discharge: Family;Available 24 hours/day (husband available ans sister lives across the street) Type of Home: Other(Comment) (town house) Home Access: Stairs to enter Technical brewer of Steps: 2 Entrance Stairs-Rails: Right;Left;Can reach both [place]: One level     Bathroom Shower/Tub: Occupational psychologist: Handicapped height     Home Equipment: [place] (2 wheels);Rollator (4 wheels);BSC/3in1;Hand held shower head;Grab bars - tub/shower          Prior Functioning/Environment Prior Level of Function : Independent/Modified Independent;Driving             Mobility Comments: no AD ADLs Comments: Idep, drives, performs her own IADL in the community. Loves to play cards, go to the beach, and exercise at the [facility]        OT Problem List: Decreased strength;Decreased activity tolerance;Decreased range of motion;Decreased knowledge of precautions;Impaired UE functional use      OT Treatment/Interventions:      OT Goals(Current goals can  be found in the care plan section) Acute Rehab OT Goals Patient Stated Goal: be able to use my R shoulder OT Goal Formulation: With  patient  OT Frequency:      Co-evaluation              AM-PAC OT "6 Clicks" Daily Activity     Outcome Measure Help from another person eating meals?: A Little Help from another person taking care of personal grooming?: None Help from another person toileting, which includes using toliet, bedpan, or urinal?: None Help from another person bathing (including washing, rinsing, drying)?: A Little Help from another person to put on and taking off regular upper body clothing?: A Little Help from another person to put on and taking off regular lower body clothing?: None 6 Click Score: 21   End of Session Equipment Utilized During Treatment: Other (comment) (R shoulder sling) Nurse Communication: Mobility status  Activity Tolerance: Patient tolerated treatment well Patient left: in bed;with call bell/phone within reach  OT Visit Diagnosis: Muscle weakness (generalized) (M62.81)                Time: [id number] OT Time Calculation (min): 43 min Charges:  OT General Charges $OT Visit: 1 Visit OT Evaluation $OT Eval Low Complexity: 1 Low OT Treatments $Self Care/Home Management : 23-37 mins  Elder Cyphers, OTR/L [facility] Acute Rehabilitation Office: [phone]   Magnus Ivan 02/01/2023, 8:46 AM

## 2023-02-01 NOTE — Evaluation (Signed)
Physical Therapy Evaluation Patient Details Name: Ann Fox MRN: [id number] DOB: Nov 22, 1940 Today's Date: 02/01/2023  History of Present Illness  Pt is an 82 y.o. female s/p R reverse shoulder arthroplasty and biceps tenodesis. PMH significant for GERD, HTN, osteopenia, sleep apnea.  Clinical Impression  Pt is presenting mostly at baseline for functional mobility. She is limited with her RUE due to recent rotator cuff surgery. Pt will benefit from physician recommended rehab services whether PT/OT for R shoulder once able after discharge from acute care hospital setting. No concerns from a functional mobility stand point for discharge home with PRN assistance once medically stable. Pt is cleared from physical therapy and will be discharge from physical therapy services in the acute care setting at this time. Please re-consult if further needs arise.        Recommendations for follow up therapy are one component of a multi-disciplinary discharge planning process, led by the attending physician.  Recommendations may be updated based on patient status, additional functional criteria and insurance authorization.  Follow Up Recommendations       Assistance Recommended at Discharge PRN  Patient can return home with the following  Assistance with cooking/housework    Equipment Recommendations None recommended by PT  Recommendations for Other Services       Functional Status Assessment Patient has had a recent decline in their functional status and demonstrates the ability to make significant improvements in function in a reasonable and predictable amount of time.     Precautions / Restrictions Precautions Precautions: Shoulder Type of Shoulder Precautions: Per orders, NWB, ok for elbow/wrist/hand ROM, no AROM of shoulder, external rotation 0-30. Sling at all times except for ADL and exercise.      Mobility  Bed Mobility Overal bed mobility: Modified Independent Bed Mobility: Sit to  Supine       Sit to supine: Modified independent (Device/Increase time)   General bed mobility comments: For sit to supine pt had no difficulty with HOB slightly elevated. Patient Response: Cooperative  Transfers Overall transfer level: Modified independent Equipment used: None               General transfer comment: no assist needed; good maintenance of NWB precautions through RUE    Ambulation/Gait Ambulation/Gait assistance: Independent Gait Distance (Feet): 150 Feet Assistive device: None Gait Pattern/deviations: WFL(Within Functional Limits)   Gait velocity interpretation: >2.62 ft/sec, indicative of community ambulatory   General Gait Details: slightly wider BOS initially but improved with gait.  Stairs Stairs: Yes Stairs assistance: Modified independent (Device/Increase time) Stair Management: One rail Right, Forwards, Step to pattern Number of Stairs: 2 General stair comments: pt had no difficulty with stairs but does require a railing which she has available at home.     Balance Overall balance assessment: Modified Independent         Pertinent Vitals/Pain Pain Assessment Pain Assessment: No/denies pain    Home Living Family/patient expects to be discharged to:: Private residence Living Arrangements: Spouse/significant other Available Help at Discharge: Family;Available 24 hours/day (husband available ans sister lives across the street) Type of Home: Other(Comment) (town house) Home Access: Stairs to enter Entrance Stairs-Rails: Right;Left;Can reach both Technical brewer of Steps: 2   [place]: One level [place]: [place] (2 wheels);Rollator (4 wheels);BSC/3in1;Hand held shower head;Grab bars - tub/shower      Prior Function Prior Level of Function : Independent/Modified Independent;Driving             Mobility  Comments: no AD ADLs Comments: Idep, drives, performs her own IADL in the  community. Loves to play cards, go to the beach, and exercise at the [place] Hand: Right    Extremity/Trunk Assessment   Upper Extremity Assessment Upper Extremity Assessment: Defer to OT evaluation    Lower Extremity Assessment Lower Extremity Assessment: Overall WFL for tasks assessed    Cervical / Trunk Assessment Cervical / Trunk Assessment: Normal  Communication   Communication: No difficulties  Cognition Arousal/Alertness: Awake/alert Behavior During Therapy: WFL for tasks assessed/performed Overall Cognitive Status: Within Functional Limits for tasks assessed       General Comments General comments (skin integrity, edema, etc.): No significnat concerns for discharge home with intermittent assistance as needed.        Assessment/Plan    PT Assessment All further PT needs can be met in the next venue of care  PT Problem List Decreased range of motion;Decreased strength       PT Treatment Interventions      PT Goals (Current goals can be found in the Care Plan section)  Acute Rehab PT Goals PT Goal Formulation: All assessment and education complete, DC therapy     AM-PAC PT "6 Clicks" Mobility  Outcome Measure Help needed turning from your back to your side while in a flat bed without using bedrails?: A Little Help needed moving from lying on your back to sitting on the side of a flat bed without using bedrails?: A Little Help needed moving to and from a bed to a chair (including a wheelchair)?: None Help needed standing up from a chair using your arms (e.g., wheelchair or bedside chair)?: None Help needed to walk in hospital room?: None Help needed climbing 3-5 steps with a railing? : None 6 Click Score: 22    End of Session Equipment Utilized During Treatment: Gait belt Activity Tolerance: Patient tolerated treatment well Patient left: in bed;with call bell/phone within reach Nurse Communication: Mobility status PT Visit  Diagnosis: Other (comment);Pain (RC surgery) Pain - Right/Left: Right Pain - part of body: Shoulder    Time: 0901-0909 PT Time Calculation (min) (ACUTE ONLY): 8 min   Charges:   PT Evaluation $PT Eval Low Complexity: [place], DPT, CLT  Acute Rehabilitation Services Office: [phone] (Secure chat preferred)   Ander Purpura 02/01/2023, 11:28 AM

## 2023-02-05 ENCOUNTER — Telehealth: Payer: Self-pay | Admitting: Orthopedic Surgery

## 2023-02-05 NOTE — Telephone Encounter (Signed)
Patient called. She would like to know if the chair was ordered for her by Lurena Joiner? Also would like to know the name of the chair he requested for her to have.

## 2023-02-08 DIAGNOSIS — M7521 Bicipital tendinitis, right shoulder: Secondary | ICD-10-CM

## 2023-02-08 DIAGNOSIS — M19011 Primary osteoarthritis, right shoulder: Secondary | ICD-10-CM

## 2023-02-12 ENCOUNTER — Telehealth: Payer: Self-pay | Admitting: Surgical

## 2023-02-12 NOTE — Telephone Encounter (Signed)
Called pt 1X nd left vm asking pt to reschedule post op appt for 4/11 at 1:15pm if possible if not please call Crystal or transfer call to [facility]

## 2023-02-14 ENCOUNTER — Ambulatory Visit (INDEPENDENT_AMBULATORY_CARE_PROVIDER_SITE_OTHER): Payer: PPO | Admitting: Surgical

## 2023-02-14 ENCOUNTER — Other Ambulatory Visit (INDEPENDENT_AMBULATORY_CARE_PROVIDER_SITE_OTHER): Payer: PPO

## 2023-02-14 ENCOUNTER — Encounter: Payer: Self-pay | Admitting: Surgical

## 2023-02-14 DIAGNOSIS — Z96611 Presence of right artificial shoulder joint: Secondary | ICD-10-CM

## 2023-02-14 NOTE — Progress Notes (Signed)
Post-Op Visit Note   Patient: Ann Fox           Date of Birth: 1941/09/11           MRN: [id number] Visit Date: 02/14/2023 PCP: Hadley Pen, MD   Assessment & Plan:  Chief Complaint:  Chief Complaint  Patient presents with   Right Shoulder - Routine Post Op    01/31/23 (2w) Right Reverse Shoulder Arthroplasty      Visit Diagnoses:  1. S/P reverse total shoulder arthroplasty, right     Plan: Ann Fox is a 82 y.o. female who presents s/p right reverse shoulder arthroplasty on 01/31/2023.  Patient is doing well and pain is overall controlled.  Denies any chest pain, SOB, fevers, chills.  No complaint of any instability symptoms.  Taking pain medication only at night.  She is having a little bit of difficulty sleeping.  She is able to sleep in the bed for about 2 hours and then she sits to a recliner.  Overall she does not have any severe pain and just describes an achy discomfort in the lateral aspect of the shoulder.  Ice is helping.  On exam, patient has range of motion 15 degrees X rotation, 50 degrees abduction, 80 degrees forward elevation passively.  Intact EPL, FPL, finger abduction, finger adduction, pronation/supination, bicep, tricep, deltoid of operative extremity.  Axillary nerve intact with deltoid firing.  Incision is healing well without evidence of infection or dehiscence.  Incision was made sure to be covered with Steri-Strips from the proximal to distal aspect of the length of the incision.  2+ radial pulse of the operative extremity  Plan is discontinue sling.  Okay to very lightly lift with the operative extremity but no lifting anything heavier than a coffee cup or cell phone.  Start physical therapy to focus on passive range of motion and active range of motion with deltoid isometrics.  Do not want to externally rotate past 30 degrees to protect subscapularis repair.  Follow-up in 4 weeks for clinical recheck.    Follow-Up Instructions: No  follow-ups on file.   Orders:  Orders Placed This Encounter  Procedures   XR Shoulder Right   No orders of the defined types were placed in this encounter.   Imaging: No results found.  PMFS History: Patient Active Problem List   Diagnosis Date Noted   Arthritis of right shoulder region 02/08/2023   Biceps tendonitis on right 02/08/2023   S/P reverse total shoulder arthroplasty, right 01/31/2023   Pain in right shoulder 10/03/2022   Pain in left shoulder 01/30/2022   Pain in right hip 09/14/2020   Lumbar herniated disc 08/18/2020   Lumbago with sciatica, right side 07/21/2020   Degenerative disc disease, lumbar 07/21/2020   Calcification of abdominal aorta 07/21/2020   Osteopenia    Past Medical History:  Diagnosis Date   Elevated cholesterol    GERD (gastroesophageal reflux disease)    Hypertension    Osteopenia    Sleep apnea     Family History  Problem Relation Age of Onset   Cancer Mother        Bladder cancer   Osteoporosis Mother    Colon cancer Father    Hypertension Father    Cancer Maternal Grandmother    Breast cancer Cousin        Mat. 1st cousins   Cancer Maternal Uncle     Past Surgical History:  Procedure Laterality Date   APPENDECTOMY  BREAST SURGERY     Breast mass   LIPOMA EXCISION  1970   REVERSE SHOULDER ARTHROPLASTY Right 01/31/2023   Procedure: RIGHT REVERSE SHOULDER ARTHROPLASTY;  Surgeon: Cammy Copa, MD;  Location: [facility] OR;  Service: Orthopedics;  Laterality: Right;   TONSILLECTOMY AND ADENOIDECTOMY     TUBAL LIGATION     WRIST SURGERY     Cyst   Social History   Occupational History   Not on file  Tobacco Use   Smoking status: Former   Smokeless tobacco: Not on file  Vaping Use   Vaping Use: Never used  Substance and Sexual Activity   Alcohol use: No   Drug use: Not on file   Sexual activity: Yes    Birth control/protection: Post-menopausal

## 2023-02-15 ENCOUNTER — Encounter: Payer: PPO | Admitting: Surgical

## 2023-02-17 NOTE — Discharge Summary (Signed)
Physician Discharge Summary      Patient ID: Ann Fox MRN: [id number] DOB/AGE: May 06, 1941 82 y.o.  Admit date: 01/31/2023 Discharge date: 02/01/2023  Admission Diagnoses:  Principal Problem:   S/P reverse total shoulder arthroplasty, right Active Problems:   Arthritis of right shoulder region   Biceps tendonitis on right   Discharge Diagnoses:  Same  Surgeries: Procedure(s): RIGHT REVERSE SHOULDER ARTHROPLASTY on 01/31/2023   Consultants:   Discharged Condition: Stable  Hospital Course: RAYSHELL GOECKE is an 82 y.o. female who was admitted 01/31/2023 with a chief complaint of right shoulder pain, and found to have a diagnosis of right shoulder osteoarthritis.  They were brought to the operating room on 01/31/2023 and underwent the above named procedures.  Pt awoke from anesthesia without complication and was transferred to the floor. On POD1, patient's pain was well-controlled.  She had no red flag symptoms.  Vital signs are stable.  She was discharged home on POD 1 after working with occupational therapy..  Pt will f/u with Dr. August Saucer in clinic in ~2 weeks.   Antibiotics given:  Anti-infectives (From admission, onward)    Start     Dose/Rate Route Frequency Ordered Stop   01/31/23 1800  ceFAZolin (ANCEF) IVPB 2g/100 mL premix  Status:  Discontinued        2 g 200 mL/hr over 30 Minutes Intravenous Every 8 hours 01/31/23 1624 02/01/23 1532   01/31/23 1242  vancomycin (VANCOCIN) powder  Status:  Discontinued          As needed 01/31/23 1242 01/31/23 1442   01/31/23 0900  ceFAZolin (ANCEF) IVPB 2g/100 mL premix        2 g 200 mL/hr over 30 Minutes Intravenous On call to O.R. 01/31/23 4098 01/31/23 1144     .  Recent vital signs:  Vitals:   02/01/23 0406 02/01/23 0942  BP: 132/72 129/66  Pulse: 85 71  Resp: 18 17  Temp:  98 F (36.7 C)  SpO2: 94% 96%    Recent laboratory studies:  Results for orders placed or performed during the hospital encounter of 01/24/23   Surgical pcr screen   Specimen: Nasal Mucosa; Nasal Swab  Result Value Ref Range   MRSA, PCR NEGATIVE NEGATIVE   Staphylococcus aureus NEGATIVE NEGATIVE  Urine Culture (for pregnant, neutropenic or urologic patients or patients with an indwelling urinary catheter)   Specimen: Urine, Clean Catch  Result Value Ref Range   Specimen Description URINE, CLEAN CATCH    Special Requests NONE    Culture (A)     <10,000 COLONIES/mL INSIGNIFICANT GROWTH Performed at [facility], [address], [place], [place] [address]    Report Status 01/25/2023 FINAL   CBC  Result Value Ref Range   WBC 4.5 4.0 - 10.5 K/uL   RBC 5.25 (H) 3.87 - 5.11 MIL/uL   Hemoglobin 14.7 12.0 - 15.0 g/dL   HCT 78.2 95.6 - 21.3 %   MCV 83.0 80.0 - 100.0 fL   MCH 28.0 26.0 - 34.0 pg   MCHC 33.7 30.0 - 36.0 g/dL   RDW 08.6 57.8 - 46.9 %   Platelets 188 150 - 400 K/uL   nRBC 0.0 0.0 - 0.2 %  Basic metabolic panel  Result Value Ref Range   Sodium 138 135 - 145 mmol/L   Potassium 3.2 (L) 3.5 - 5.1 mmol/L   Chloride 102 98 - 111 mmol/L   CO2 28 22 - 32 mmol/L   Glucose, Bld 98  70 - 99 mg/dL   BUN 15 8 - 23 mg/dL   Creatinine, Ser 1.61 0.44 - 1.00 mg/dL   Calcium 9.2 8.9 - 09.6 mg/dL   GFR, Estimated >04 >54 mL/min   Anion gap 8 5 - 15  Urinalysis, Routine w reflex microscopic -Urine, Clean Catch  Result Value Ref Range   Color, Urine YELLOW YELLOW   APPearance CLEAR CLEAR   Specific Gravity, Urine 1.015 1.005 - 1.030   pH 5.0 5.0 - 8.0   Glucose, UA NEGATIVE NEGATIVE mg/dL   Hgb urine dipstick NEGATIVE NEGATIVE   Bilirubin Urine NEGATIVE NEGATIVE   Ketones, ur NEGATIVE NEGATIVE mg/dL   Protein, ur NEGATIVE NEGATIVE mg/dL   Nitrite NEGATIVE NEGATIVE   Leukocytes,Ua NEGATIVE NEGATIVE    Discharge Medications:   Allergies as of 02/01/2023   No Known Allergies      Medication List     STOP taking these medications    traMADol 50 MG tablet Commonly known as: Ultram       TAKE these  medications    amLODipine 5 MG tablet Commonly known as: NORVASC Take 2.5 mg by mouth daily.   aspirin EC 81 MG tablet Take 81 mg by mouth daily. Swallow whole.   docusate sodium 100 MG capsule Commonly known as: COLACE Take 1 capsule (100 mg total) by mouth 2 (two) times daily.   esomeprazole 40 MG capsule Commonly known as: NEXIUM Take 40 mg by mouth daily before breakfast.   hydrochlorothiazide 12.5 MG capsule Commonly known as: MICROZIDE Take 12.5 mg by mouth daily.   meclizine 25 MG tablet Commonly known as: ANTIVERT Take 25 mg by mouth 3 (three) times daily as needed for dizziness.   meloxicam 15 MG tablet Commonly known as: MOBIC Take 15 mg by mouth daily.   methocarbamol 500 MG tablet Commonly known as: ROBAXIN Take 1 tablet (500 mg total) by mouth every 8 (eight) hours as needed for muscle spasms.   multivitamin tablet Take 2 tablets by mouth daily.   oxyCODONE 5 MG immediate release tablet Commonly known as: Oxy IR/ROXICODONE Take 1 tablet (5 mg total) by mouth every 4 (four) hours as needed for moderate pain (pain score 4-6).   rosuvastatin 20 MG tablet Commonly known as: CRESTOR Take 20 mg by mouth daily.        Diagnostic Studies: XR Shoulder Right  Result Date: 02/14/2023 AP, Scap views of right shoulder reviewed.  No fracture or dislocation.  Right shoulder reverse shoulder prosthesis in good position alignment without any complicating features.  DG Shoulder Right Port  Result Date: 01/31/2023 CLINICAL DATA:  Status post right shoulder arthroplasty EXAM: RIGHT SHOULDER - 1 VIEW COMPARISON:  10/03/2022 FINDINGS: There is interval reverse arthroplasty right shoulder. There are pockets of air in the soft tissues. No fracture or dislocation is seen. IMPRESSION: Status post right shoulder arthroplasty. Electronically Signed   By: Ernie Avena M.D.   On: 01/31/2023 15:57    Disposition: Discharge disposition: 01-Home or Self  Care       Discharge Instructions     Call MD / Call 911   Complete by: As directed    If you experience chest pain or shortness of breath, CALL 911 and be transported to the hospital emergency room.  If you develope a fever above 101 F, pus (white drainage) or increased drainage or redness at the wound, or calf pain, call your surgeon's office.   Constipation Prevention   Complete by: As directed  Drink plenty of fluids.  Prune juice may be helpful.  You may use a stool softener, such as Colace (over the counter) 100 mg twice a day.  Use MiraLax (over the counter) for constipation as needed.   Diet - low sodium heart healthy   Complete by: As directed    Discharge instructions   Complete by: As directed    You may shower, dressing is waterproof.  Do not bathe or soak the operative shoulder in a tub, pool.  Use the CPM machine 3 times a day for one hour each time.  No lifting with the operative shoulder. Continue use of the sling.  Follow-up with Dr. August Saucer in ~2 weeks on your given appointment date.  We will remove your adhesive bandage at that time.    Dental Antibiotics:  In most cases prophylactic antibiotics for Dental procdeures after total joint surgery are not necessary.  Exceptions are as follows:  1. History of prior total joint infection  2. Severely immunocompromised (Organ Transplant, cancer chemotherapy, Rheumatoid biologic meds such as Humera)  3. Poorly controlled diabetes (A1C &gt; 8.0, blood glucose over 200)  If you have one of these conditions, contact your surgeon for an antibiotic prescription, prior to your dental procedure.   Increase activity slowly as tolerated   Complete by: As directed    Post-operative opioid taper instructions:   Complete by: As directed    POST-OPERATIVE OPIOID TAPER INSTRUCTIONS: It is important to wean off of your opioid medication as soon as possible. If you do not need pain medication after your surgery it is ok to stop day  one. Opioids include: Codeine, Hydrocodone(Norco, Vicodin), Oxycodone(Percocet, oxycontin) and hydromorphone amongst others.  Long term and even short term use of opiods can cause: Increased pain response Dependence Constipation Depression Respiratory depression And more.  Withdrawal symptoms can include Flu like symptoms Nausea, vomiting And more Techniques to manage these symptoms Hydrate well Eat regular healthy meals Stay active Use relaxation techniques(deep breathing, meditating, yoga) Do Not substitute Alcohol to help with tapering If you have been on opioids for less than two weeks and do not have pain than it is ok to stop all together.  Plan to wean off of opioids This plan should start within one week post op of your joint replacement. Maintain the same interval or time between taking each dose and first decrease the dose.  Cut the total daily intake of opioids by one tablet each day Next start to increase the time between doses. The last dose that should be eliminated is the evening dose.             SignedKarenann Cai 02/17/2023, 4:11 PM

## 2023-02-20 ENCOUNTER — Encounter: Payer: PPO | Admitting: Orthopedic Surgery

## 2023-02-25 ENCOUNTER — Telehealth: Payer: Self-pay | Admitting: Orthopedic Surgery

## 2023-02-25 NOTE — Telephone Encounter (Signed)
Last OV note faxed.

## 2023-02-25 NOTE — Telephone Encounter (Signed)
P/o notes needed for P/T tomorrow at [facility] please fax to Maralyn Sago) [phone]

## 2023-03-13 ENCOUNTER — Encounter: Payer: Self-pay | Admitting: Orthopedic Surgery

## 2023-03-13 ENCOUNTER — Ambulatory Visit (INDEPENDENT_AMBULATORY_CARE_PROVIDER_SITE_OTHER): Payer: PPO | Admitting: Orthopedic Surgery

## 2023-03-13 DIAGNOSIS — Z96611 Presence of right artificial shoulder joint: Secondary | ICD-10-CM

## 2023-03-13 MED ORDER — MELOXICAM 15 MG PO TABS: ORAL_TABLET | ORAL | 0 refills | Status: DC

## 2023-03-13 NOTE — Progress Notes (Signed)
Post-Op Visit Note   Patient: Ann Fox           Date of Birth: 04-24-1941           MRN: [id number] Visit Date: 03/13/2023 PCP: Hadley Pen, MD   Assessment & Plan:  Chief Complaint:  Chief Complaint  Patient presents with   Right Shoulder - Routine Post Op, Follow-up    right reverse shoulder arthroplasty on 01/31/2023   Visit Diagnoses:  1. S/P reverse total shoulder arthroplasty, right     Plan: Darris is a patient is 6 weeks out right reverse shoulder replacement and biceps tenodesis.  Performed 01/23/2023.  In sling doing well overall.  In physical therapy 2 times a week plus home exercise program.  She is sleeping well.  Doing therapy in [facility].  On exam she has range of motion of 30/80/95.  Incision intact.  I would like for her to really focus the next 6 weeks on stretching to achieve more range of motion.  She did have some unusual pigmentation around the periphery of what would have been the Aquacel dressing but nothing that needs intervention at this time.  That should be rechecked at the next clinic visit as well.  Follow-Up Instructions: No follow-ups on file.   Orders:  No orders of the defined types were placed in this encounter.  Meds ordered this encounter  Medications   DISCONTD: meloxicam (MOBIC) 15 MG tablet    Sig: Take one tablet by mouth every 3 days as needed for pain/swelling    Dispense:  30 tablet    Refill:  0    Imaging: No results found.  PMFS History: Patient Active Problem List   Diagnosis Date Noted   Arthritis of right shoulder region 02/08/2023   Biceps tendonitis on right 02/08/2023   S/P reverse total shoulder arthroplasty, right 01/31/2023   Pain in right shoulder 10/03/2022   Pain in left shoulder 01/30/2022   Pain in right hip 09/14/2020   Lumbar herniated disc 08/18/2020   Lumbago with sciatica, right side 07/21/2020   Degenerative disc disease, lumbar 07/21/2020   Calcification of abdominal aorta (HCC)  07/21/2020   Osteopenia    Past Medical History:  Diagnosis Date   Elevated cholesterol    GERD (gastroesophageal reflux disease)    Hypertension    Osteopenia    Sleep apnea     Family History  Problem Relation Age of Onset   Cancer Mother        Bladder cancer   Osteoporosis Mother    Colon cancer Father    Hypertension Father    Cancer Maternal Grandmother    Breast cancer Cousin        Mat. 1st cousins   Cancer Maternal Uncle     Past Surgical History:  Procedure Laterality Date   APPENDECTOMY     BREAST SURGERY     Breast mass   LIPOMA EXCISION  1970   REVERSE SHOULDER ARTHROPLASTY Right 01/31/2023   Procedure: RIGHT REVERSE SHOULDER ARTHROPLASTY;  Surgeon: Cammy Copa, MD;  Location: [facility] OR;  Service: Orthopedics;  Laterality: Right;   TONSILLECTOMY AND ADENOIDECTOMY     TUBAL LIGATION     WRIST SURGERY     Cyst   Social History   Occupational History   Not on file  Tobacco Use   Smoking status: Former   Smokeless tobacco: Not on file  Vaping Use   Vaping Use: Never used  Substance and  Sexual Activity   Alcohol use: No   Drug use: Not on file   Sexual activity: Yes    Birth control/protection: Post-menopausal

## 2023-03-14 ENCOUNTER — Telehealth: Payer: Self-pay

## 2023-03-14 NOTE — Telephone Encounter (Signed)
Pls see note from Dr August Saucer

## 2023-03-14 NOTE — Telephone Encounter (Signed)
-----   Message from Cammy Copa, MD sent at 03/13/2023  8:28 PM EDT ----- Cresenciano Lick can you have Loraine Leriche to follow-up with [facility] in 6 weeks.

## 2023-04-24 ENCOUNTER — Encounter: Payer: PPO | Admitting: Orthopedic Surgery

## 2023-04-24 ENCOUNTER — Ambulatory Visit (INDEPENDENT_AMBULATORY_CARE_PROVIDER_SITE_OTHER): Payer: PPO | Admitting: Surgical

## 2023-04-24 DIAGNOSIS — Z96611 Presence of right artificial shoulder joint: Secondary | ICD-10-CM

## 2023-04-25 ENCOUNTER — Encounter: Payer: Self-pay | Admitting: Surgical

## 2023-04-25 NOTE — Progress Notes (Signed)
Post-Op Visit Note   Patient: Ann Fox           Date of Birth: [birth year]           MRN: [id number] Visit Date: 04/24/2023 PCP: Hadley Pen, MD   Assessment & Plan:  Chief Complaint:  Chief Complaint  Patient presents with   Right Shoulder - Routine Post Op   Visit Diagnoses:  1. S/P reverse total shoulder arthroplasty, right     Plan: Patient is an 82 year old female who presents s/p right reverse shoulder arthroplasty on 01/31/2023.  She feels she is doing great.  She is going to physical therapy 1 time a week and she just started strengthening exercises.  Still has a small amount of pain at times but overall she is very pleased and not taking any pain medication.  On exam, patient has 50 degrees external rotation, 80 degrees abduction, 130 degrees forward elevation passively.  She is able to get her arm overhead she can do her hair.  She has incision that is well-healed without evidence of infection or dehiscence.  Intact EPL, FPL, finger abduction, deltoid function with axillary nerve intact with deltoid firing.  Plan is follow-up with the office as needed.  Continue with physical therapy per the patient but she would be okay to transition to home exercise program at this point.  Counseled her on the long-term lifting restriction and antibiotic dental prophylaxis.  Follow-up as needed.  Follow-Up Instructions: No follow-ups on file.   Orders:  No orders of the defined types were placed in this encounter.  No orders of the defined types were placed in this encounter.   Imaging: No results found.  PMFS History: Patient Active Problem List   Diagnosis Date Noted   Arthritis of right shoulder region 02/08/2023   Biceps tendonitis on right 02/08/2023   S/P reverse total shoulder arthroplasty, right 01/31/2023   Pain in right shoulder 10/03/2022   Pain in left shoulder 01/30/2022   Pain in right hip 09/14/2020   Lumbar herniated disc 08/18/2020   Lumbago with  sciatica, right side 07/21/2020   Degenerative disc disease, lumbar 07/21/2020   Calcification of abdominal aorta (HCC) 07/21/2020   Osteopenia    Past Medical History:  Diagnosis Date   Elevated cholesterol    GERD (gastroesophageal reflux disease)    Hypertension    Osteopenia    Sleep apnea     Family History  Problem Relation Age of Onset   Cancer Mother        Bladder cancer   Osteoporosis Mother    Colon cancer Father    Hypertension Father    Cancer Maternal Grandmother    Breast cancer Cousin        Mat. 1st cousins   Cancer Maternal Uncle     Past Surgical History:  Procedure Laterality Date   APPENDECTOMY     BREAST SURGERY     Breast mass   LIPOMA EXCISION  1970   REVERSE SHOULDER ARTHROPLASTY Right 01/31/2023   Procedure: RIGHT REVERSE SHOULDER ARTHROPLASTY;  Surgeon: Cammy Copa, MD;  Location: [facility] OR;  Service: Orthopedics;  Laterality: Right;   TONSILLECTOMY AND ADENOIDECTOMY     TUBAL LIGATION     WRIST SURGERY     Cyst   Social History   Occupational History   Not on file  Tobacco Use   Smoking status: Former   Smokeless tobacco: Not on file  Vaping Use   Vaping Use: Never  used  Substance and Sexual Activity   Alcohol use: No   Drug use: Not on file   Sexual activity: Yes    Birth control/protection: Post-menopausal

## 2023-09-11 ENCOUNTER — Ambulatory Visit: Payer: PPO | Admitting: Orthopedic Surgery

## 2023-11-12 ENCOUNTER — Telehealth: Payer: Self-pay | Admitting: Physical Medicine and Rehabilitation

## 2023-12-03 ENCOUNTER — Telehealth: Payer: Self-pay | Admitting: Orthopedic Surgery

## 2023-12-03 NOTE — Telephone Encounter (Signed)
Patient called needing an antibiotic sent to her pharmacy. Patient said she has a dental appointment 12/10/2023. Patient said she is having a crown put in. Patient also said she will be having a cleaning of her teeth on another day. The number to contact patient is [phone]

## 2023-12-04 MED ORDER — AMOXICILLIN 500 MG PO CAPS: ORAL_CAPSULE | ORAL | 0 refills | Status: AC

## 2023-12-04 NOTE — Telephone Encounter (Signed)
Sent to pharmacy. I called patient and advised.

## 2024-03-29 IMAGING — MR MR SHOULDER*L* W/O CM
5 series · 40 of 40 positions shown · non-contrast
Comparison: None Available.

CLINICAL DATA: Shoulder pain, limited range of motion

EXAM:
MRI OF THE LEFT SHOULDER WITHOUT CONTRAST
TECHNIQUE: Multiplanar, multisequence MR imaging of the shoulder was performed.
No intravenous contrast was administered.

[Series 3: T2 fat-sat · axial · left · 3.0mm · 0.47mm/px · z∈[-32,+56]mm · 8 of 24 slices shown (1 of 3)]
[im 1/24]
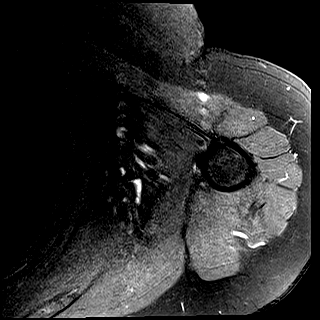
[im 4/24]
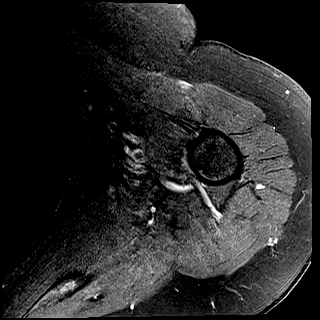
[im 7/24]
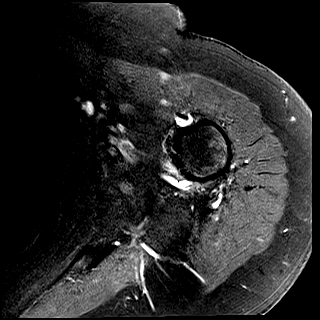
[im 10/24]
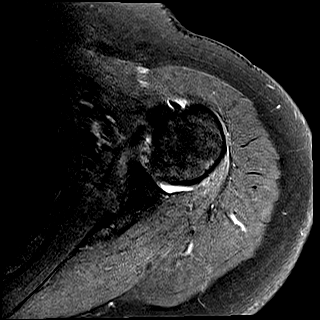
[im 14/24]
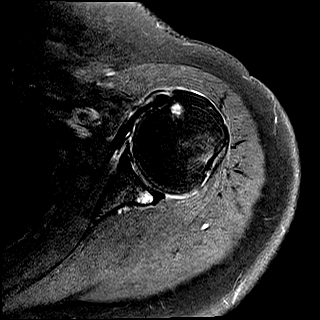
[im 17/24]
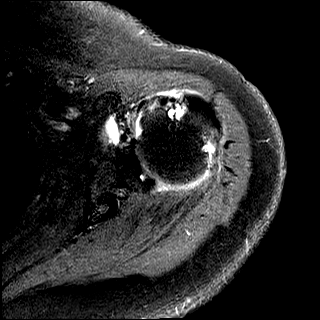
[im 20/24]
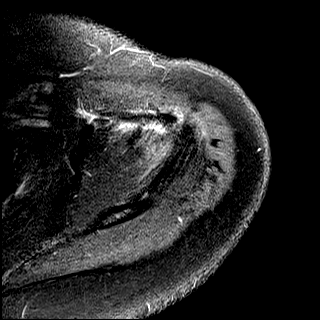
[im 24/24]
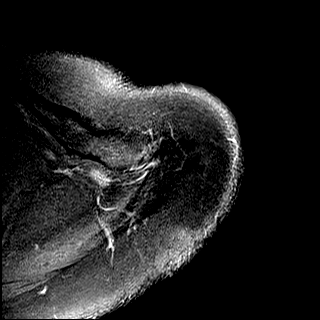

[Series 4: T2 fat-sat · oblique · left · 3.0mm · 0.44mm/px · 8 of 23 slices shown (2 of 3)]
[im 1/23]
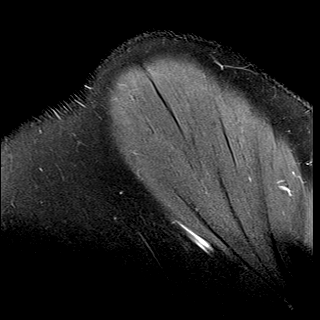
[im 4/23]
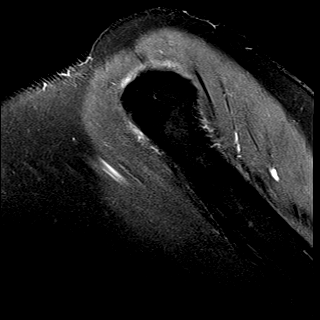
[im 7/23]
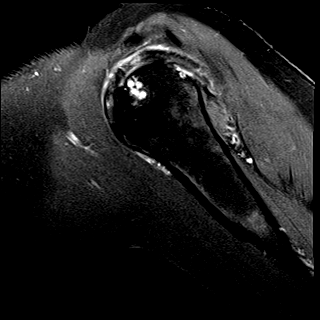
[im 10/23]
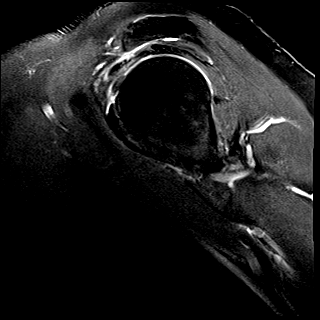
[im 13/23]
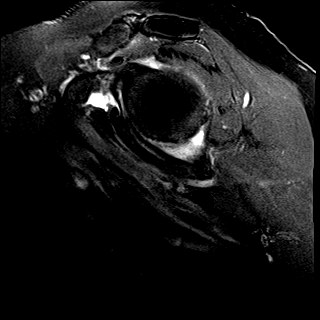
[im 16/23]
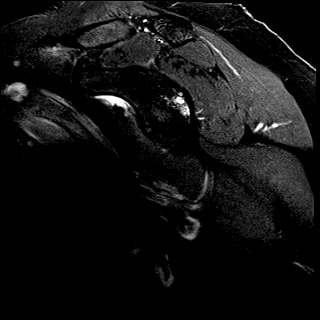
[im 19/23]
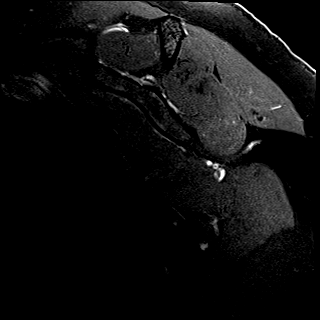
[im 23/23]
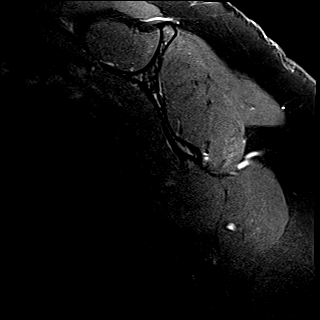

[Series 5: T1 · oblique · left · 3.0mm · 0.55mm/px · 8 of 23 slices shown]
[im 1/23]
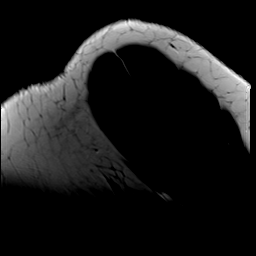
[im 4/23]
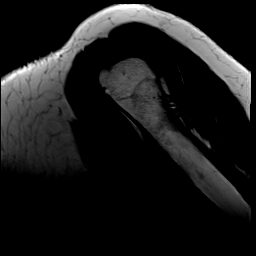
[im 7/23]
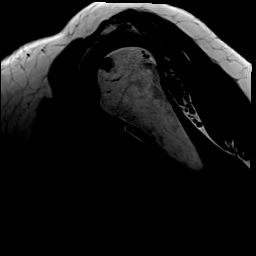
[im 10/23]
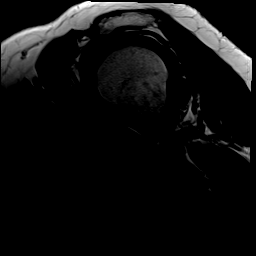
[im 13/23]
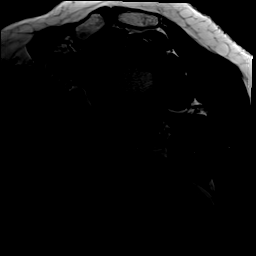
[im 16/23]
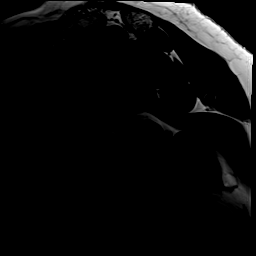
[im 19/23]
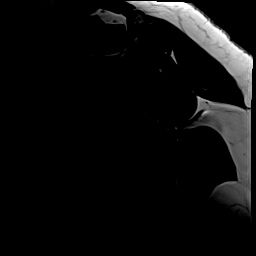
[im 23/23]
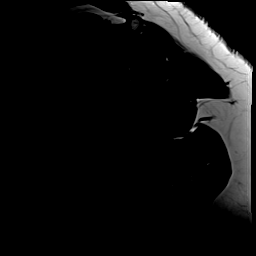

[Series 6: PD · oblique · left · 3.0mm · 0.44mm/px · 8 of 21 slices shown]
[im 1/21]
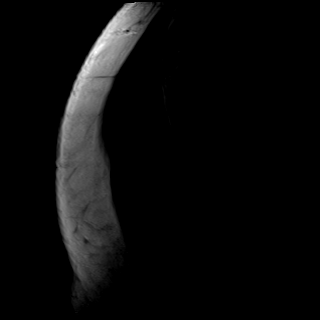
[im 3/21]
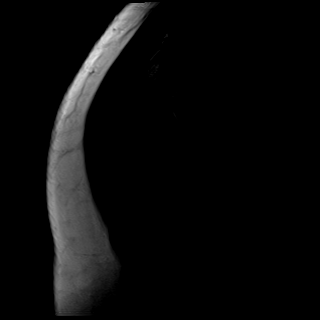
[im 6/21]
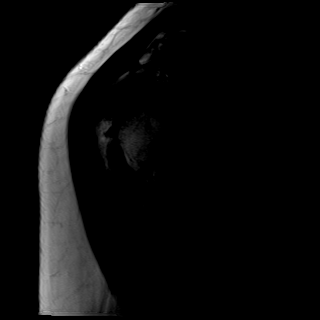
[im 9/21]
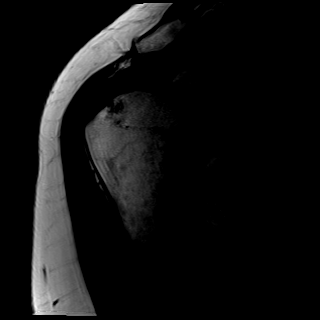
[im 12/21]
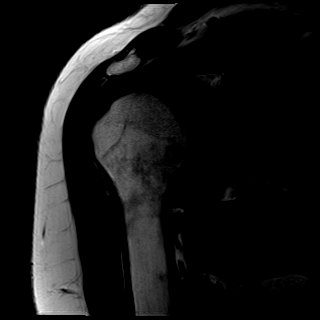
[im 15/21]
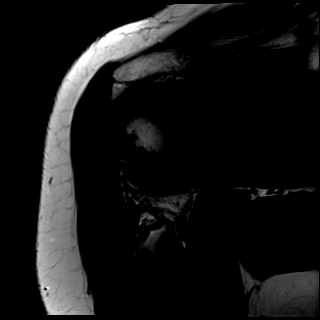
[im 18/21]
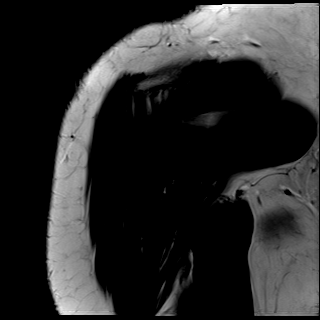
[im 21/21]
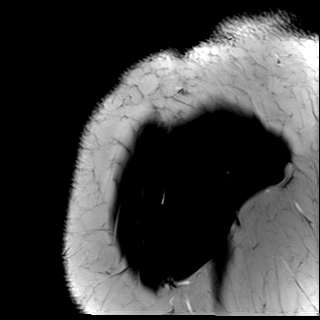

[Series 7: T2 fat-sat · oblique · left · 3.0mm · 0.44mm/px · 8 of 21 slices shown (3 of 3)]
[im 1/21]
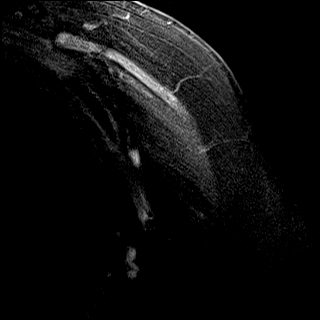
[im 3/21]
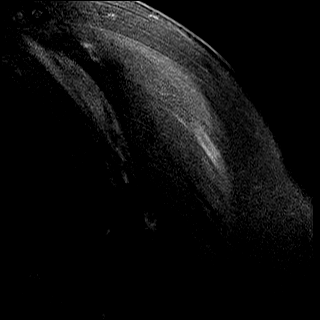
[im 6/21]
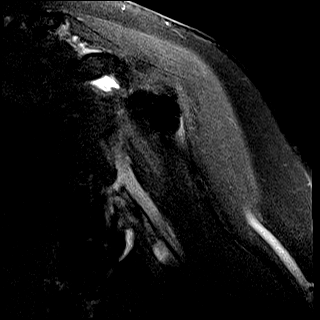
[im 9/21]
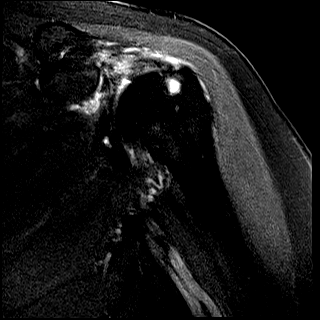
[im 12/21]
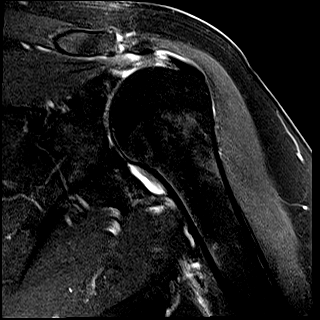
[im 15/21]
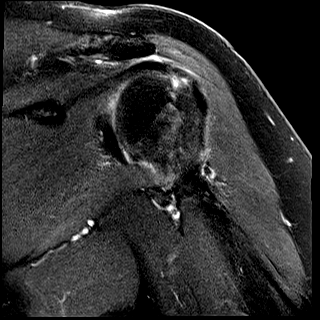
[im 18/21]
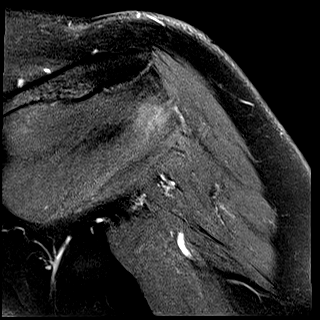
[im 21/21]
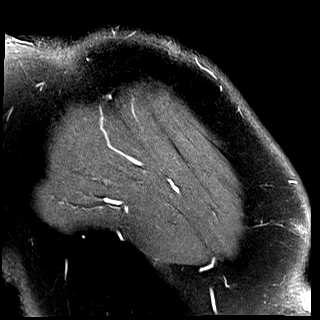

[40 of 40 positions shown; findings below may reference images not displayed]

FINDINGS: Rotator cuff: Moderate tendinosis of the supraspinatus tendon with a
small partial-thickness bursal surface tear anteriorly. Mild
tendinosis of the infraspinatus tendon with a small
partial-thickness articular surface tear. Teres minor tendon is
intact. Subscapularis tendon is intact.

Muscles: No muscle atrophy or edema. No intramuscular fluid
collection or hematoma.

Biceps Long Head: Severe tendinosis of the intra-articular portion
of the long head of the biceps tendon.

Acromioclavicular Joint: Mild arthropathy of the acromioclavicular
joint. Trace subacromial/subdeltoid bursal fluid.

Glenohumeral Joint: Partial-thickness cartilage loss with areas of
high-grade partial-thickness cartilage loss of the glenohumeral
joint and subchondral reactive marrow changes in the glenoid. Small
joint effusion.

Labrum: Superior labral degeneration.

Bones: No fracture or dislocation. No aggressive osseous lesion.
Subcortical reactive marrow changes at the supraspinatus insertion
and infraspinatus insertion.

Other: No fluid collection or hematoma.
IMPRESSION: 1. Moderate tendinosis of the supraspinatus tendon with a small
partial-thickness bursal surface tear anteriorly.
2. Mild tendinosis of the infraspinatus tendon with a small
partial-thickness articular surface tear.
3. Severe tendinosis of the intra-articular portion of the long head
of the biceps tendon.
4. Moderate osteoarthritis of the glenohumeral joint.

## 2024-06-25 ENCOUNTER — Ambulatory Visit (INDEPENDENT_AMBULATORY_CARE_PROVIDER_SITE_OTHER): Admitting: Orthopedic Surgery

## 2024-06-25 ENCOUNTER — Other Ambulatory Visit (INDEPENDENT_AMBULATORY_CARE_PROVIDER_SITE_OTHER): Payer: Self-pay

## 2024-06-25 ENCOUNTER — Other Ambulatory Visit: Payer: Self-pay

## 2024-06-25 DIAGNOSIS — M545 Low back pain, unspecified: Secondary | ICD-10-CM

## 2024-06-25 DIAGNOSIS — M7061 Trochanteric bursitis, right hip: Secondary | ICD-10-CM

## 2024-06-25 DIAGNOSIS — M25552 Pain in left hip: Secondary | ICD-10-CM | POA: Diagnosis not present

## 2024-06-25 DIAGNOSIS — M25551 Pain in right hip: Secondary | ICD-10-CM

## 2024-06-25 DIAGNOSIS — M7062 Trochanteric bursitis, left hip: Secondary | ICD-10-CM

## 2024-06-26 ENCOUNTER — Encounter: Payer: Self-pay | Admitting: Orthopedic Surgery

## 2024-06-26 NOTE — Progress Notes (Signed)
 Office Visit Note   Patient: Ann Fox           Date of Birth: 14-Feb-1941           MRN: [id number] Visit Date: 06/25/2024 Requested by: Silver Lamar LABOR, MD [address] [place],  [place] [address] PCP: Silver Lamar LABOR, MD  Subjective: Chief Complaint  Patient presents with   Right Hip - Pain   Left Hip - Pain    HPI: Ann Fox is a 83 y.o. female who presents to the office reporting bilateral hip pain left equal to right.  The pain is primarily in the lateral aspect of both hips.  Last injection in the trochanteric bursa was given years ago which helped until recently.  Does hurt when she is weightbearing too long.  Denies any groin pain.  Sometimes she reports buttock pain which radiates into her legs.  Uses ibuprofen with good relief.  She likes to walk about 1/2 mile at least daily but then she does have some locking up of her hips..                ROS: All systems reviewed are negative as they relate to the chief complaint within the history of present illness.  Patient denies fevers or chills.  Assessment & Plan: Visit Diagnoses:  1. Bilateral hip pain   2. Low back pain, unspecified back pain laterality, unspecified chronicity, unspecified whether sciatica present     Plan: Impression is likely trochanteric bursitis with pretty reasonable hip radiographs.  Not too much arthritis present.  I think she may have lumbar spine degeneration causing some of the symptoms.  Trochanteric injection performed today under ultrasound guidance.  Will see how she does with that and she will follow-up with us  as needed.  Iliotibial band stretching encouraged.  Follow-Up Instructions: No follow-ups on file.   Orders:  Orders Placed This Encounter  Procedures   XR Lumbar Spine 2-3 Views   XR HIPS BILAT W OR W/O PELVIS 3-4 VIEWS   US  Guided Needle Placement - No Linked Charges   No orders of the defined types were placed in this encounter.     Procedures: Large Joint  Inj: R greater trochanter on 06/25/2024 4:34 PM Indications: pain and diagnostic evaluation Details: 22 G 3.5 in needle, ultrasound-guided lateral approach  Arthrogram: No  Medications: 5 mL lidocaine  1 %; 4 mL bupivacaine  0.25 %; 40 mg triamcinolone  acetonide 40 MG/ML Outcome: tolerated well, no immediate complications Procedure, treatment alternatives, risks and benefits explained, specific risks discussed. Consent was given by the patient. Immediately prior to procedure a time out was called to verify the correct patient, procedure, equipment, support staff and site/side marked as required. Patient was prepped and draped in the usual sterile fashion.       Clinical Data: No additional findings.  Objective: Vital Signs: There were no vitals taken for this visit.  Physical Exam:  Constitutional: Patient appears well-developed HEENT:  Head: Normocephalic Eyes:EOM are normal Neck: Normal range of motion Cardiovascular: Normal rate Pulmonary/chest: Effort normal Neurologic: Patient is alert Skin: Skin is warm Psychiatric: Patient has normal mood and affect  Ortho Exam: Ortho exam demonstrates full active and passive range of motion of the hips without too much groin pain with internal and external rotation.  5 out of 5 ankle dorsiflexion plantarflexion quad and hamstring strength.  No masses lymphadenopathy or skin changes noted in that right hip or left hip region.  She does have  trochanteric tenderness bilaterally but is worse on the right-hand side  Specialty Comments:  MRI LUMBAR SPINE WITHOUT CONTRAST   TECHNIQUE: Multiplanar, multisequence MR imaging of the lumbar spine was performed. No intravenous contrast was administered.   COMPARISON:  Radiography 07/21/2020   FINDINGS: Segmentation:  5 lumbar type vertebral bodies.   Alignment: Mild curvature convex to the right with the apex at L2-3. 1 mm degenerative anterolisthesis L3-4.   Vertebrae:  No fracture or  primary bone lesion.   Conus medullaris and cauda equina: Conus extends to the L1-2 level. Conus and cauda equina appear normal.   Paraspinal and other soft tissues: Negative   Disc levels:   No abnormality at L2-3 or above.   L3-4: Mild bulging of the disc. Mild facet and ligamentous hypertrophy. Mild narrowing of the lateral recesses without visible neural compression.   L4-5: Broad-based disc herniation with caudal migration. Extruded fragment on the right measuring 7 mm. Facet and ligamentous hypertrophy at this level. Multifactorial spinal stenosis at the disc level that could cause neural compression on either or both sides. Particular potential for compression of the right L5 nerve because of the right-sided caudally migrated fragment.   L5-S1: Chronic disc degeneration with loss of disc height. Minimal bulging of the disc. No canal or foraminal stenosis.   IMPRESSION: 1. L4-5: Broad-based disc herniation with caudal migration of a free fragment measuring 7 mm to the right of midline. Facet and ligamentous hypertrophy. Multifactorial spinal stenosis that could cause neural compression on either or both sides. Particular potential for compression of the right L5 nerve because of the caudally migrated fragment. 2. L3-4: Disc bulge. Mild facet and ligamentous hypertrophy. Mild narrowing of the lateral recesses without visible neural compression. Findings at this level could contribute to back pain.     Electronically Signed   By: Oneil Officer M.D.   On: 08/14/2020 05:22  Imaging: No results found.   PMFS History: Patient Active Problem List   Diagnosis Date Noted   Arthritis of right shoulder region 02/08/2023   Biceps tendonitis on right 02/08/2023   S/P reverse total shoulder arthroplasty, right 01/31/2023   Pain in right shoulder 10/03/2022   Pain in left shoulder 01/30/2022   Pain in right hip 09/14/2020   Lumbar herniated disc 08/18/2020   Lumbago with  sciatica, right side 07/21/2020   Degenerative disc disease, lumbar 07/21/2020   Calcification of abdominal aorta (HCC) 07/21/2020   Osteopenia    Past Medical History:  Diagnosis Date   Elevated cholesterol    GERD (gastroesophageal reflux disease)    Hypertension    Osteopenia    Sleep apnea     Family History  Problem Relation Age of Onset   Cancer Mother        Bladder cancer   Osteoporosis Mother    Colon cancer Father    Hypertension Father    Cancer Maternal Grandmother    Breast cancer Cousin        Mat. 1st cousins   Cancer Maternal Uncle     Past Surgical History:  Procedure Laterality Date   APPENDECTOMY     BREAST SURGERY     Breast mass   LIPOMA EXCISION  1970   REVERSE SHOULDER ARTHROPLASTY Right 01/31/2023   Procedure: RIGHT REVERSE SHOULDER ARTHROPLASTY;  Surgeon: Addie Cordella Hamilton, MD;  Location: [facility] OR;  Service: Orthopedics;  Laterality: Right;   TONSILLECTOMY AND ADENOIDECTOMY     TUBAL LIGATION     WRIST SURGERY  Cyst   Social History   Occupational History   Not on file  Tobacco Use   Smoking status: Former   Smokeless tobacco: Not on file  Vaping Use   Vaping status: Never Used  Substance and Sexual Activity   Alcohol use: No   Drug use: Not on file   Sexual activity: Yes    Birth control/protection: Post-menopausal

## 2024-06-27 ENCOUNTER — Encounter: Payer: Self-pay | Admitting: Orthopedic Surgery

## 2024-06-27 MED ORDER — LIDOCAINE HCL 1 % IJ SOLN
5.0000 mL | INTRAMUSCULAR | Status: AC | PRN
  Administered 2024-06-25: 5 mL

## 2024-06-27 MED ORDER — BUPIVACAINE HCL 0.25 % IJ SOLN
4.0000 mL | INTRAMUSCULAR | Status: AC | PRN
  Administered 2024-06-25: 4 mL via INTRA_ARTICULAR

## 2024-06-27 MED ORDER — TRIAMCINOLONE ACETONIDE 40 MG/ML IJ SUSP
40.0000 mg | INTRAMUSCULAR | Status: AC | PRN
  Administered 2024-06-25: 40 mg via INTRA_ARTICULAR

## 2024-09-07 ENCOUNTER — Encounter: Payer: Self-pay | Admitting: Radiology
